# Patient Record
Sex: Male | Born: 1955
Health system: Southern US, Community
[De-identification: ages and names within clinical notes are randomized; demographics above are authoritative.]

## PROBLEM LIST (undated history)

## (undated) DIAGNOSIS — K219 Gastro-esophageal reflux disease without esophagitis: Secondary | ICD-10-CM

## (undated) DIAGNOSIS — G4733 Obstructive sleep apnea (adult) (pediatric): Secondary | ICD-10-CM

## (undated) DIAGNOSIS — I10 Essential (primary) hypertension: Secondary | ICD-10-CM

## (undated) DIAGNOSIS — N400 Enlarged prostate without lower urinary tract symptoms: Secondary | ICD-10-CM

## (undated) DIAGNOSIS — R7989 Other specified abnormal findings of blood chemistry: Secondary | ICD-10-CM

## (undated) DIAGNOSIS — R079 Chest pain, unspecified: Secondary | ICD-10-CM

## (undated) HISTORY — PX: HERNIA REPAIR: SHX51

## (undated) HISTORY — PX: LITHOTRIPSY: SUR834

## (undated) HISTORY — PX: GALLBLADDER SURGERY: SHX652

---

## 1998-09-24 HISTORY — PX: KNEE SURGERY: SHX244

## 2000-09-24 HISTORY — PX: EYE SURGERY: SHX253

## 2005-01-10 ENCOUNTER — Encounter: Payer: Self-pay | Admitting: Family Medicine

## 2005-01-10 ENCOUNTER — Ambulatory Visit: Payer: Self-pay | Admitting: Family Medicine

## 2005-01-24 DIAGNOSIS — G4733 Obstructive sleep apnea (adult) (pediatric): Secondary | ICD-10-CM | POA: Insufficient documentation

## 2007-05-16 ENCOUNTER — Ambulatory Visit: Payer: Self-pay | Admitting: Urology

## 2007-05-23 ENCOUNTER — Ambulatory Visit: Payer: Self-pay | Admitting: Urology

## 2007-10-29 ENCOUNTER — Ambulatory Visit: Payer: Self-pay | Admitting: Urology

## 2008-03-19 ENCOUNTER — Ambulatory Visit: Payer: Self-pay | Admitting: Unknown Physician Specialty

## 2008-03-19 DIAGNOSIS — K5730 Diverticulosis of large intestine without perforation or abscess without bleeding: Secondary | ICD-10-CM | POA: Insufficient documentation

## 2008-03-19 LAB — HM COLONOSCOPY

## 2008-10-28 DIAGNOSIS — K219 Gastro-esophageal reflux disease without esophagitis: Secondary | ICD-10-CM | POA: Insufficient documentation

## 2008-10-28 DIAGNOSIS — I10 Essential (primary) hypertension: Secondary | ICD-10-CM | POA: Insufficient documentation

## 2008-11-23 ENCOUNTER — Ambulatory Visit: Payer: Self-pay | Admitting: Urology

## 2008-11-29 ENCOUNTER — Ambulatory Visit: Payer: Self-pay | Admitting: Family Medicine

## 2009-03-02 DIAGNOSIS — E559 Vitamin D deficiency, unspecified: Secondary | ICD-10-CM | POA: Insufficient documentation

## 2009-11-23 ENCOUNTER — Ambulatory Visit: Payer: Self-pay | Admitting: Urology

## 2010-02-27 DIAGNOSIS — E669 Obesity, unspecified: Secondary | ICD-10-CM | POA: Insufficient documentation

## 2010-11-29 ENCOUNTER — Ambulatory Visit: Payer: Self-pay | Admitting: Urology

## 2011-11-28 ENCOUNTER — Ambulatory Visit: Payer: Self-pay | Admitting: Urology

## 2012-06-11 DIAGNOSIS — N401 Enlarged prostate with lower urinary tract symptoms: Secondary | ICD-10-CM | POA: Insufficient documentation

## 2012-06-11 DIAGNOSIS — N200 Calculus of kidney: Secondary | ICD-10-CM | POA: Insufficient documentation

## 2012-06-11 DIAGNOSIS — N529 Male erectile dysfunction, unspecified: Secondary | ICD-10-CM | POA: Insufficient documentation

## 2012-06-11 DIAGNOSIS — N138 Other obstructive and reflux uropathy: Secondary | ICD-10-CM | POA: Insufficient documentation

## 2012-06-11 DIAGNOSIS — R6882 Decreased libido: Secondary | ICD-10-CM | POA: Insufficient documentation

## 2012-12-24 ENCOUNTER — Ambulatory Visit: Payer: Self-pay | Admitting: Urology

## 2013-01-25 ENCOUNTER — Encounter (HOSPITAL_COMMUNITY): Payer: Self-pay | Admitting: *Deleted

## 2013-01-25 ENCOUNTER — Emergency Department: Payer: Self-pay | Admitting: Emergency Medicine

## 2013-01-25 ENCOUNTER — Observation Stay (HOSPITAL_COMMUNITY)
Admission: EM | Admit: 2013-01-25 | Discharge: 2013-01-27 | Disposition: A | Discharge status: Home / Self Care | Payer: BC Managed Care – PPO | Attending: General Surgery | Admitting: General Surgery

## 2013-01-25 ENCOUNTER — Other Ambulatory Visit: Payer: Self-pay

## 2013-01-25 DIAGNOSIS — S2611XA Contusion of heart without hemopericardium, initial encounter: Secondary | ICD-10-CM

## 2013-01-25 DIAGNOSIS — R071 Chest pain on breathing: Secondary | ICD-10-CM | POA: Insufficient documentation

## 2013-01-25 DIAGNOSIS — I4510 Unspecified right bundle-branch block: Secondary | ICD-10-CM | POA: Insufficient documentation

## 2013-01-25 DIAGNOSIS — R61 Generalized hyperhidrosis: Secondary | ICD-10-CM | POA: Insufficient documentation

## 2013-01-25 DIAGNOSIS — G4733 Obstructive sleep apnea (adult) (pediatric): Secondary | ICD-10-CM | POA: Insufficient documentation

## 2013-01-25 DIAGNOSIS — E669 Obesity, unspecified: Secondary | ICD-10-CM | POA: Insufficient documentation

## 2013-01-25 DIAGNOSIS — I10 Essential (primary) hypertension: Secondary | ICD-10-CM | POA: Insufficient documentation

## 2013-01-25 DIAGNOSIS — Z79899 Other long term (current) drug therapy: Secondary | ICD-10-CM | POA: Insufficient documentation

## 2013-01-25 DIAGNOSIS — N400 Enlarged prostate without lower urinary tract symptoms: Secondary | ICD-10-CM | POA: Insufficient documentation

## 2013-01-25 DIAGNOSIS — S20219A Contusion of unspecified front wall of thorax, initial encounter: Secondary | ICD-10-CM

## 2013-01-25 DIAGNOSIS — S2691XA Contusion of heart, unspecified with or without hemopericardium, initial encounter: Principal | ICD-10-CM

## 2013-01-25 HISTORY — DX: Gastro-esophageal reflux disease without esophagitis: K21.9

## 2013-01-25 HISTORY — DX: Other specified abnormal findings of blood chemistry: R79.89

## 2013-01-25 HISTORY — DX: Obstructive sleep apnea (adult) (pediatric): G47.33

## 2013-01-25 HISTORY — DX: Benign prostatic hyperplasia without lower urinary tract symptoms: N40.0

## 2013-01-25 HISTORY — DX: Chest pain, unspecified: R07.9

## 2013-01-25 HISTORY — DX: Essential (primary) hypertension: I10

## 2013-01-25 LAB — PROTIME-INR: Prothrombin Time: 14.2 secs (ref 11.5–14.7)

## 2013-01-25 LAB — TROPONIN I: Troponin-I: <0.02 ng/mL

## 2013-01-25 LAB — CBC
HGB: 14.8 g/dL (ref 13.0–18.0)
MCH: 30.6 pg (ref 26.0–34.0)
MCV: 90 fL (ref 80–100)
Platelet: 275 10*3/uL (ref 150–440)
RDW: 13.7 % (ref 11.5–14.5)
WBC: 12 10*3/uL — ABNORMAL HIGH (ref 3.8–10.6)

## 2013-01-25 LAB — COMPREHENSIVE METABOLIC PANEL
Alkaline Phosphatase: 115 U/L (ref 50–136)
Bilirubin,Total: 0.4 mg/dL (ref 0.2–1.0)
Calcium, Total: 8.8 mg/dL (ref 8.5–10.1)
Chloride: 110 mmol/L — ABNORMAL HIGH (ref 98–107)
Co2: 24 mmol/L (ref 21–32)
Creatinine: 1.74 mg/dL — ABNORMAL HIGH (ref 0.60–1.30)
EGFR (African American): 49 — ABNORMAL LOW
EGFR (Non-African Amer.): 42 — ABNORMAL LOW
Osmolality: 287 (ref 275–301)
Sodium: 142 mmol/L (ref 136–145)

## 2013-01-25 LAB — POCT I-STAT TROPONIN I: Troponin i, poc: 0 ng/mL (ref 0.00–0.08)

## 2013-01-25 MED ORDER — HYDROCODONE-ACETAMINOPHEN 5-325 MG PO TABS: 0.5000 | ORAL_TABLET | ORAL | Status: DC | PRN

## 2013-01-25 MED ORDER — DOCUSATE SODIUM 100 MG PO CAPS
100.0000 mg | ORAL_CAPSULE | Freq: Two times a day (BID) | ORAL | Status: DC
  Administered 2013-01-26 – 2013-01-27 (×3): 100 mg via ORAL
  Filled 2013-01-25 (×3): qty 1

## 2013-01-25 MED ORDER — SODIUM CHLORIDE 0.9 % IJ SOLN
3.0000 mL | Freq: Two times a day (BID) | INTRAMUSCULAR | Status: DC
  Administered 2013-01-26 – 2013-01-27 (×3): 3 mL via INTRAVENOUS

## 2013-01-25 MED ORDER — HYDROMORPHONE HCL PF 1 MG/ML IJ SOLN
1.0000 mg | INTRAMUSCULAR | Status: DC | PRN
  Administered 2013-01-26: 1 mg via INTRAVENOUS
  Filled 2013-01-25: qty 1

## 2013-01-25 MED ORDER — SODIUM CHLORIDE 0.9 % IJ SOLN: 3.0000 mL | INTRAMUSCULAR | Status: DC | PRN

## 2013-01-25 MED ORDER — ONDANSETRON HCL 4 MG PO TABS: 4.0000 mg | ORAL_TABLET | Freq: Four times a day (QID) | ORAL | Status: DC | PRN

## 2013-01-25 MED ORDER — MORPHINE SULFATE 4 MG/ML IJ SOLN
4.0000 mg | Freq: Once | INTRAMUSCULAR | Status: AC
  Administered 2013-01-25: 4 mg via INTRAVENOUS
  Filled 2013-01-25: qty 1

## 2013-01-25 MED ORDER — PANTOPRAZOLE SODIUM 40 MG PO TBEC: 40.0000 mg | DELAYED_RELEASE_TABLET | Freq: Every day | ORAL | Status: DC

## 2013-01-25 MED ORDER — AMLODIPINE BESY-BENAZEPRIL HCL 10-20 MG PO CAPS: 1.0000 | ORAL_CAPSULE | Freq: Every day | ORAL | Status: DC

## 2013-01-25 MED ORDER — ONDANSETRON HCL 4 MG/2ML IJ SOLN: 4.0000 mg | Freq: Four times a day (QID) | INTRAMUSCULAR | Status: DC | PRN

## 2013-01-25 MED ORDER — PANTOPRAZOLE SODIUM 40 MG IV SOLR
40.0000 mg | Freq: Every day | INTRAVENOUS | Status: DC
  Filled 2013-01-25: qty 40

## 2013-01-25 MED ORDER — ALFUZOSIN HCL ER 10 MG PO TB24
10.0000 mg | ORAL_TABLET | Freq: Every day | ORAL | Status: DC
  Administered 2013-01-26 (×2): 10 mg via ORAL
  Filled 2013-01-25 (×3): qty 1

## 2013-01-25 MED ORDER — ONDANSETRON HCL 4 MG/2ML IJ SOLN
4.0000 mg | Freq: Once | INTRAMUSCULAR | Status: AC
  Administered 2013-01-25: 4 mg via INTRAVENOUS
  Filled 2013-01-25: qty 2

## 2013-01-25 MED ORDER — ENOXAPARIN SODIUM 40 MG/0.4ML ~~LOC~~ SOLN
40.0000 mg | Freq: Every day | SUBCUTANEOUS | Status: DC
  Filled 2013-01-25: qty 0.4

## 2013-01-25 MED ORDER — SODIUM CHLORIDE 0.9 % IV SOLN: 250.0000 mL | INTRAVENOUS | Status: DC | PRN

## 2013-01-25 NOTE — ED Provider Notes (Signed)
I saw and evaluated the patient, reviewed the resident's note and I agree with the findings and plan.   Rolan Bucco, MD 01/25/13 [phone]
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
## 2013-01-25 NOTE — ED Notes (Signed)
Dr. Wyatt at bedside to assess pt. 
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
## 2013-01-26 ENCOUNTER — Observation Stay (HOSPITAL_COMMUNITY): Payer: BC Managed Care – PPO

## 2013-01-26 ENCOUNTER — Encounter (HOSPITAL_COMMUNITY): Payer: Self-pay | Admitting: Nurse Practitioner

## 2013-01-26 DIAGNOSIS — E669 Obesity, unspecified: Secondary | ICD-10-CM | POA: Insufficient documentation

## 2013-01-26 DIAGNOSIS — I10 Essential (primary) hypertension: Secondary | ICD-10-CM

## 2013-01-26 DIAGNOSIS — R079 Chest pain, unspecified: Secondary | ICD-10-CM

## 2013-01-26 DIAGNOSIS — S2691XA Contusion of heart, unspecified with or without hemopericardium, initial encounter: Secondary | ICD-10-CM

## 2013-01-26 DIAGNOSIS — I519 Heart disease, unspecified: Secondary | ICD-10-CM

## 2013-01-26 DIAGNOSIS — G4733 Obstructive sleep apnea (adult) (pediatric): Secondary | ICD-10-CM | POA: Insufficient documentation

## 2013-01-26 LAB — BASIC METABOLIC PANEL
BUN: 16 mg/dL (ref 6–23)
Calcium: 8.9 mg/dL (ref 8.4–10.5)
GFR calc Af Amer: 70 mL/min — ABNORMAL LOW (ref >90)
GFR calc non Af Amer: 60 mL/min — ABNORMAL LOW (ref >90)
Glucose, Bld: 123 mg/dL — ABNORMAL HIGH (ref 70–99)

## 2013-01-26 LAB — CBC
HCT: 40.1 % (ref 39.0–52.0)
Hemoglobin: 13.5 g/dL (ref 13.0–17.0)
MCH: 30.5 pg (ref 26.0–34.0)
MCHC: 33.7 g/dL (ref 30.0–36.0)
RDW: 14.2 % (ref 11.5–15.5)

## 2013-01-26 MED ORDER — HYDROMORPHONE HCL PF 1 MG/ML IJ SOLN
0.5000 mg | INTRAMUSCULAR | Status: DC | PRN
  Administered 2013-01-26: 0.5 mg via INTRAVENOUS
  Filled 2013-01-26: qty 1

## 2013-01-26 MED ORDER — ENOXAPARIN SODIUM 30 MG/0.3ML ~~LOC~~ SOLN
30.0000 mg | Freq: Two times a day (BID) | SUBCUTANEOUS | Status: DC
  Administered 2013-01-26 – 2013-01-27 (×3): 30 mg via SUBCUTANEOUS
  Filled 2013-01-26 (×5): qty 0.3

## 2013-01-26 MED ORDER — NAPROXEN 500 MG PO TABS
500.0000 mg | ORAL_TABLET | Freq: Two times a day (BID) | ORAL | Status: DC
  Administered 2013-01-26 – 2013-01-27 (×2): 500 mg via ORAL
  Filled 2013-01-26 (×7): qty 1

## 2013-01-26 MED ORDER — AMLODIPINE BESYLATE 10 MG PO TABS
10.0000 mg | ORAL_TABLET | Freq: Every day | ORAL | Status: DC
  Administered 2013-01-26 – 2013-01-27 (×2): 10 mg via ORAL
  Filled 2013-01-26 (×2): qty 1

## 2013-01-26 MED ORDER — BENAZEPRIL HCL 20 MG PO TABS
20.0000 mg | ORAL_TABLET | Freq: Every day | ORAL | Status: DC
  Administered 2013-01-26 – 2013-01-27 (×2): 20 mg via ORAL
  Filled 2013-01-26 (×2): qty 1

## 2013-01-26 MED ORDER — PANTOPRAZOLE SODIUM 40 MG PO TBEC
40.0000 mg | DELAYED_RELEASE_TABLET | Freq: Every day | ORAL | Status: DC
  Administered 2013-01-26 – 2013-01-27 (×2): 40 mg via ORAL
  Filled 2013-01-26 (×2): qty 1

## 2013-01-26 MED ORDER — HYDROCODONE-ACETAMINOPHEN 10-325 MG PO TABS
0.5000 | ORAL_TABLET | ORAL | Status: DC | PRN
  Administered 2013-01-26 (×2): 0.5 via ORAL
  Filled 2013-01-26 (×2): qty 1

## 2013-01-26 NOTE — Progress Notes (Signed)
  Echocardiogram 2D Echocardiogram has been performed.  Jose Lewis 01/26/2013, 3:44 PM

## 2013-01-26 NOTE — Progress Notes (Signed)
UR completed 
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
## 2013-01-26 NOTE — Progress Notes (Signed)
Appreciate cardiology input.  R chest wall and sternum tender.  Working with PT. Patient examined and I agree with the assessment and plan  Violeta Gelinas, MD, MPH, FACS Pager: [phone]  01/26/2013 3:35 PM
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
## 2013-01-26 NOTE — Progress Notes (Signed)
PT has refused CPAP machine for the night. Pt states he slept fine last night with his nasal cannula and believes he will do fine again tonight with just the cannula. Pt also says he should be leaving in the morning and does not want to try our CPAP. RT advised PT to call if he changes his mind or if he feels like he needs the machine to sleep. RT will continue to assist as needed.
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
## 2013-01-27 ENCOUNTER — Encounter (HOSPITAL_COMMUNITY): Payer: Self-pay | Admitting: General Practice

## 2013-01-27 DIAGNOSIS — N400 Enlarged prostate without lower urinary tract symptoms: Secondary | ICD-10-CM | POA: Insufficient documentation

## 2013-01-27 MED ORDER — NAPROXEN 500 MG PO TABS: 500.0000 mg | ORAL_TABLET | Freq: Two times a day (BID) | ORAL | Status: DC

## 2013-01-27 MED ORDER — HYDROCODONE-ACETAMINOPHEN 5-325 MG PO TABS
1.0000 | ORAL_TABLET | ORAL | Status: DC | PRN
Start: 2013-01-27 — End: 2013-05-13

## 2013-01-27 NOTE — Progress Notes (Signed)
Ready for D/C Violeta Gelinas, MD, MPH, FACS Pager: [phone]
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
## 2013-01-27 NOTE — Discharge Summary (Signed)
Kolette Vey, MD, MPH, FACS Pager: [phone]  

## 2013-01-27 NOTE — Progress Notes (Addendum)
Physical Therapy Treatment Patient Details Name: Jose Lewis MRN: [id number] DOB: [birth year] Today's Date: 01/27/2013 Time: 7829-5621 PT Time Calculation (min): 38 min  PT Assessment / Plan / Recommendation Comments on Treatment Session  Pt still with guarded, painful movement, but all education on safe mobility and progression from this point in recovery completed.  Pt ready for D/C.    Follow Up Recommendations  No PT follow up;Supervision - Intermittent     Does the patient have the potential to tolerate intense rehabilitation     Barriers to Discharge        Equipment Recommendations  None recommended by PT    Recommendations for Other Services    Frequency Min 3X/week   Plan Discharge plan remains appropriate;Frequency remains appropriate    Precautions / Restrictions Precautions Precautions: None Restrictions Weight Bearing Restrictions: No   Pertinent Vitals/Pain Not rated, but painful chest wall bilaterally     Mobility  Bed Mobility Bed Mobility: Rolling Left;Left Sidelying to Sit;Sitting - Scoot to [place] of Bed;Sit to Sidelying Left Rolling Left: 5: Supervision Left Sidelying to Sit: 4: Min guard;HOB flat (with or without rail) Sitting - Scoot to Edge of Bed: 6: Modified independent (Device/Increase time) Sit to Sidelying Left: 5: Supervision;HOB flat;Other (comment) (no rail) Details for Bed Mobility Assistance: Practiced methods for supine to/from EOB without use of rail Transfers Transfers: Sit to Stand;Stand to Sit Sit to Stand: 6: Modified independent (Device/Increase time) Stand to Sit: 6: Modified independent (Device/Increase time) Details for Transfer Assistance: vc's for hand placement, not assist needed Ambulation/Gait Ambulation/Gait Assistance: 5: Supervision Ambulation Distance (Feet): 150 Feet Assistive device: None Ambulation/Gait Assistance Details: steady and smooth, but guarded Gait Pattern: Within Functional Limits Gait velocity: Slow  gait speed Stairs: Yes Stairs Assistance: 5: Supervision Stair Management Technique: One rail Right;Alternating pattern;Step to pattern;Forwards Number of Stairs: 12 Wheelchair Mobility Wheelchair Mobility: No    Exercises     PT Diagnosis:    PT Problem List:   PT Treatment Interventions:     PT Goals Acute Rehab PT Goals Potential to Achieve Goals: Good Pt will Roll Supine to Left Side: Independently PT Goal: Rolling Supine to Left Side - Progress: Progressing toward goal Pt will go Supine/Side to Sit: Independently;with HOB 0 degrees PT Goal: Supine/Side to Sit - Progress: Progressing toward goal Pt will go Sit to Supine/Side: Independently;with HOB 0 degrees PT Goal: Sit to Supine/Side - Progress: Progressing toward goal Pt will go Sit to Stand: with modified independence;with upper extremity assist PT Goal: Sit to Stand - Progress: Met Pt will Ambulate: >150 feet;with modified independence;with rolling walker PT Goal: Ambulate - Progress: Met Pt will Go Up / Down Stairs: 3-5 stairs;with supervision;with rail(s) PT Goal: Up/Down Stairs - Progress: Met  Visit Information  Last PT Received On: 01/27/13 Assistance Needed: +1    Subjective Data  Subjective: I'm concerned about going up stairs.Marland KitchenMarland KitchenI'll be going to my sister's home...the beds are upstairs.   Cognition  Cognition Arousal/Alertness: Awake/alert Behavior During Therapy: WFL for tasks assessed/performed Overall Cognitive Status: Within Functional Limits for tasks assessed    Balance  Static Sitting Balance Static Sitting - Balance Support: Feet supported;No upper extremity supported Static Sitting - Level of Assistance: 7: Independent Static Standing Balance Static Standing - Balance Support: No upper extremity supported;During functional activity Static Standing - Level of Assistance: 7: Independent  End of Session PT - End of Session Activity Tolerance: Patient tolerated treatment well Patient left: in  bed;with call bell/phone within  reach Nurse Communication: Mobility status   GP Mobility: Walking and Moving Around Discharge Status [phone]): At least 1 percent but less than 20 percent impaired, limited or restricted   Kym Fenter, Eliseo Gum 01/27/2013, 11:18 AM

## 2013-01-27 NOTE — Progress Notes (Signed)
Delivered rolling walker to patient's room.  Patient refused walker stating therapy said he no longer needed one for home.  Jose Lewis [phone]

## 2013-05-01 ENCOUNTER — Ambulatory Visit: Payer: Self-pay | Admitting: Urology

## 2013-05-01 DIAGNOSIS — R972 Elevated prostate specific antigen [PSA]: Secondary | ICD-10-CM | POA: Insufficient documentation

## 2013-05-13 ENCOUNTER — Ambulatory Visit (INDEPENDENT_AMBULATORY_CARE_PROVIDER_SITE_OTHER): Payer: BC Managed Care – PPO | Admitting: General Surgery

## 2013-05-13 ENCOUNTER — Encounter: Payer: Self-pay | Admitting: General Surgery

## 2013-05-13 VITALS — BP 140/82 | HR 72 | Resp 12 | Ht 72.0 in | Wt 263.0 lb

## 2013-05-13 DIAGNOSIS — L089 Local infection of the skin and subcutaneous tissue, unspecified: Secondary | ICD-10-CM | POA: Insufficient documentation

## 2013-05-13 DIAGNOSIS — L723 Sebaceous cyst: Secondary | ICD-10-CM

## 2013-05-13 MED ORDER — SULFAMETHOXAZOLE-TRIMETHOPRIM 800-160 MG PO TABS: 1.0000 | ORAL_TABLET | Freq: Two times a day (BID) | ORAL | Status: DC

## 2013-05-13 NOTE — Patient Instructions (Signed)
Patient to return in one month. 
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
## 2013-06-10 ENCOUNTER — Ambulatory Visit: Payer: BC Managed Care – PPO | Admitting: General Surgery

## 2013-11-26 ENCOUNTER — Ambulatory Visit: Payer: Self-pay | Admitting: Urology

## 2014-07-30 ENCOUNTER — Ambulatory Visit: Payer: Self-pay | Admitting: Specialist

## 2014-08-12 ENCOUNTER — Ambulatory Visit: Payer: Self-pay | Admitting: Family Medicine

## 2014-08-18 ENCOUNTER — Ambulatory Visit: Payer: Self-pay | Admitting: Specialist

## 2015-01-15 NOTE — Op Note (Signed)
PATIENT NAME:  Jose Lewis, Jose Lewis MR#:  [id number] DATE OF BIRTH:  November 27, 1955  DATE OF PROCEDURE:  08/18/2014  PREOPERATIVE DIAGNOSES:   1.  Complex tear of the posterior horn of the medial meniscus. 2.  Tear of the inner border of the right lateral meniscus.   POSTOPERATIVE DIAGNOSES: 1.  Complex tear of the posterior horn of the medial meniscus. 2.  Tear of the inner border of the right lateral meniscus.   PROCEDURE: Arthroscopic partial right medial and lateral meniscectomy.   SURGEON: Park Breed, M.D.   ANESTHESIA: General LMA.   COMPLICATIONS: None.   DRAINS: None.   ESTIMATED BLOOD LOSS: Minimal.   REPLACEMENTS: None.   OPERATIVE FINDINGS: The patient had a complex tear of the posterior horn of the medial meniscus with large flaps. The articular surface of the femur and tibia were intact with minimal fraying. The anterior cruciate and posterior cruciates were intact. There was only mild synovitis. The lateral compartment showed a tearing of the inner border of the lateral meniscus. The popliteus tendon was intact. The patellofemoral joint showed no significant chondromalacia. There were no loose bodies. The suprapatellar pouch was normal.   OPERATIVE PROCEDURE: The patient was brought to the operating room where he underwent satisfactory general LMA anesthesia in the supine position. Arthroscopy was carried out through standard portals. The above findings were encountered. The posterior horn of the medial meniscus was debrided with basket forceps, motorized resector and ArthroCare wand. Healthy rim was obtained and all frayed and torn meniscus was removed. The knee was placed in figure-of-four position and the lateral meniscus was shown to have significant tearing of the inner border. This was debrided with basket forceps, motorized resector and ArthroCare wand as well. The popliteal hiatus was left intact. Minimal cauterization was done on the anterior synovium. The knee was  thoroughly irrigated, free of debris. The pump pressure was lowered to 20 mg and there was no significant bleeding. The instruments were removed, and the stab wounds were closed with 3-0 nylon suture. Marcaine 0.5% with epinephrine and morphine were placed in the joint and a dry sterile dressing was applied. The tourniquet was not used. The patient was awakened and taken to recovery in good condition.   ____________________________ Park Breed, MD hem:DT D: 08/18/2014 09:25:06 ET T: 08/18/2014 11:34:17 ET JOB#: [id number]  cc: Park Breed, MD, <Dictator> Park Breed MD ELECTRONICALLY SIGNED 08/22/2014 22:59

## 2015-02-10 LAB — LIPID PANEL
Cholesterol: 211 mg/dL — AB (ref 0–200)
HDL: 30 mg/dL — AB (ref 35–70)
LDL CALC: 138 mg/dL
LDL/HDL RATIO: 4.6
TRIGLYCERIDES: 214 mg/dL — AB (ref 40–160)

## 2015-02-10 LAB — HEPATIC FUNCTION PANEL
ALT: 17 U/L (ref 10–40)
AST: 14 U/L (ref 14–40)
Alkaline Phosphatase: 90 U/L (ref 25–125)

## 2015-02-10 LAB — TSH: TSH: 1.12 u[IU]/mL (ref 0.41–5.90)

## 2015-02-10 LAB — CBC AND DIFFERENTIAL
HEMATOCRIT: 43 % (ref 41–53)
HEMOGLOBIN: 14.5 g/dL (ref 13.5–17.5)
NEUTROS ABS: 4 /uL
PLATELETS: 220 10*3/uL (ref 150–399)
WBC: 5.8 10^3/mL

## 2015-02-10 LAB — BASIC METABOLIC PANEL
BUN: 11 mg/dL (ref 4–21)
Creatinine: 1.1 mg/dL (ref 0.6–1.3)
Glucose: 103 mg/dL
Potassium: 4.7 mmol/L (ref 3.4–5.3)
SODIUM: 144 mmol/L (ref 137–147)

## 2015-02-24 DIAGNOSIS — M25579 Pain in unspecified ankle and joints of unspecified foot: Secondary | ICD-10-CM | POA: Insufficient documentation

## 2015-06-13 ENCOUNTER — Ambulatory Visit (INDEPENDENT_AMBULATORY_CARE_PROVIDER_SITE_OTHER): Payer: 59 | Admitting: Family Medicine

## 2015-06-13 ENCOUNTER — Encounter: Payer: Self-pay | Admitting: Family Medicine

## 2015-06-13 VITALS — BP 128/62 | HR 92 | Temp 98.4°F | Resp 18 | Wt 274.0 lb

## 2015-06-13 DIAGNOSIS — Z9889 Other specified postprocedural states: Secondary | ICD-10-CM | POA: Insufficient documentation

## 2015-06-13 DIAGNOSIS — J309 Allergic rhinitis, unspecified: Secondary | ICD-10-CM | POA: Insufficient documentation

## 2015-06-13 DIAGNOSIS — H00013 Hordeolum externum right eye, unspecified eyelid: Secondary | ICD-10-CM

## 2015-06-13 DIAGNOSIS — L509 Urticaria, unspecified: Secondary | ICD-10-CM | POA: Diagnosis not present

## 2015-06-13 DIAGNOSIS — E785 Hyperlipidemia, unspecified: Secondary | ICD-10-CM | POA: Insufficient documentation

## 2015-06-13 DIAGNOSIS — M129 Arthropathy, unspecified: Secondary | ICD-10-CM | POA: Insufficient documentation

## 2015-06-13 MED ORDER — LORATADINE 10 MG PO TABS: 10.0000 mg | ORAL_TABLET | Freq: Every day | ORAL | Status: DC

## 2015-06-13 MED ORDER — DOXYCYCLINE HYCLATE 100 MG PO TABS: 100.0000 mg | ORAL_TABLET | Freq: Two times a day (BID) | ORAL | Status: DC

## 2015-06-13 MED ORDER — CIPROFLOXACIN HCL 0.3 % OP SOLN: 2.0000 [drp] | OPHTHALMIC | Status: DC
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
## 2015-06-14 MED ORDER — CIPROFLOXACIN HCL 0.3 % OP SOLN: 2.0000 [drp] | OPHTHALMIC | Status: DC

## 2015-06-14 NOTE — Addendum Note (Signed)
Addended by: Arnette Norris on: 06/14/2015 01:35 PM   Modules accepted: Orders

## 2015-06-30 ENCOUNTER — Telehealth: Payer: Self-pay | Admitting: Family Medicine

## 2015-06-30 DIAGNOSIS — L209 Atopic dermatitis, unspecified: Secondary | ICD-10-CM

## 2015-06-30 MED ORDER — DOXEPIN HCL 10 MG PO CAPS: 10.0000 mg | ORAL_CAPSULE | Freq: Every day | ORAL | Status: DC

## 2015-06-30 NOTE — Telephone Encounter (Signed)
Advised patient as below. Patient reports that he is already taking Loratadine 10mg  daily along with Zantac OTC. Sent in Doxepin into the pharmacy. Referral was also ordered. Will contact patient once referral has been scheduled. Thanks!

## 2015-06-30 NOTE — Telephone Encounter (Signed)
Pt states he was seen 06/13/2015 and was having itching all over his body.  Pt states he is still having the itching.  Pt is requesting a referral to see a specialist.  CB#[phone] or [phone]

## 2015-06-30 NOTE — Telephone Encounter (Signed)
Dr. Darnell Level, who would you like the patient to be referred to? Patient is not feeling any better. Please advise. Thanks!

## 2015-06-30 NOTE — Telephone Encounter (Signed)
Refer to dermatology. In the meantime would use a month's worth of each with 5 refills. loratadine 10 mg daily and doxepin 10 mg 4 times a day. Would ask patient to doxepin will make him sleepy.

## 2015-07-14 ENCOUNTER — Other Ambulatory Visit: Payer: Self-pay | Admitting: Family Medicine

## 2015-09-02 ENCOUNTER — Ambulatory Visit (INDEPENDENT_AMBULATORY_CARE_PROVIDER_SITE_OTHER): Payer: 59

## 2015-09-02 DIAGNOSIS — Z23 Encounter for immunization: Secondary | ICD-10-CM

## 2015-09-05 ENCOUNTER — Encounter: Payer: Self-pay | Admitting: Family Medicine

## 2016-03-01 ENCOUNTER — Ambulatory Visit (INDEPENDENT_AMBULATORY_CARE_PROVIDER_SITE_OTHER): Payer: BLUE CROSS/BLUE SHIELD | Admitting: Family Medicine

## 2016-03-01 ENCOUNTER — Encounter: Payer: Self-pay | Admitting: Family Medicine

## 2016-03-01 VITALS — BP 132/80 | HR 70 | Temp 98.1°F | Resp 16 | Ht 71.0 in | Wt 278.0 lb

## 2016-03-01 DIAGNOSIS — Z0000 Encounter for general adult medical examination without abnormal findings: Secondary | ICD-10-CM | POA: Diagnosis not present

## 2016-03-01 LAB — POCT URINALYSIS DIPSTICK
Bilirubin, UA: NEGATIVE
GLUCOSE UA: NEGATIVE
Ketones, UA: NEGATIVE
Leukocytes, UA: NEGATIVE
NITRITE UA: NEGATIVE
PROTEIN UA: NEGATIVE
SPEC GRAV UA: 1.01
Urobilinogen, UA: 0.2
pH, UA: 5

## 2016-03-01 LAB — IFOBT (OCCULT BLOOD): IFOBT: NEGATIVE
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
## 2016-03-03 LAB — CBC
HEMATOCRIT: 44.1 % (ref 37.5–51.0)
Hemoglobin: 15.5 g/dL (ref 12.6–17.7)
MCH: 30.3 pg (ref 26.6–33.0)
MCHC: 35.1 g/dL (ref 31.5–35.7)
MCV: 86 fL (ref 79–97)
PLATELETS: 198 10*3/uL (ref 150–379)
RBC: 5.12 x10E6/uL (ref 4.14–5.80)
RDW: 14 % (ref 12.3–15.4)
WBC: 5.3 10*3/uL (ref 3.4–10.8)

## 2016-03-03 LAB — COMPREHENSIVE METABOLIC PANEL
ALBUMIN: 4.4 g/dL (ref 3.6–4.8)
ALT: 20 IU/L (ref 0–44)
AST: 16 IU/L (ref 0–40)
Albumin/Globulin Ratio: 1.6 (ref 1.2–2.2)
Alkaline Phosphatase: 84 IU/L (ref 39–117)
BUN / CREAT RATIO: 13 (ref 10–24)
BUN: 15 mg/dL (ref 8–27)
Bilirubin Total: 0.6 mg/dL (ref 0.0–1.2)
CO2: 23 mmol/L (ref 18–29)
CREATININE: 1.16 mg/dL (ref 0.76–1.27)
Calcium: 9.6 mg/dL (ref 8.6–10.2)
Chloride: 101 mmol/L (ref 96–106)
GFR, EST AFRICAN AMERICAN: 78 mL/min/{1.73_m2} (ref >59)
GFR, EST NON AFRICAN AMERICAN: 68 mL/min/{1.73_m2} (ref >59)
GLOBULIN, TOTAL: 2.8 g/dL (ref 1.5–4.5)
GLUCOSE: 102 mg/dL — AB (ref 65–99)
Potassium: 4.4 mmol/L (ref 3.5–5.2)
SODIUM: 141 mmol/L (ref 134–144)
TOTAL PROTEIN: 7.2 g/dL (ref 6.0–8.5)

## 2016-03-03 LAB — LIPID PANEL
Chol/HDL Ratio: 6.1 ratio units — ABNORMAL HIGH (ref 0.0–5.0)
Cholesterol, Total: 209 mg/dL — ABNORMAL HIGH (ref 100–199)
HDL: 34 mg/dL — AB (ref >39)
LDL Calculated: 141 mg/dL — ABNORMAL HIGH (ref 0–99)
Triglycerides: 171 mg/dL — ABNORMAL HIGH (ref 0–149)
VLDL CHOLESTEROL CAL: 34 mg/dL (ref 5–40)

## 2016-03-03 LAB — TSH: TSH: 0.902 u[IU]/mL (ref 0.450–4.500)

## 2016-07-16 ENCOUNTER — Other Ambulatory Visit: Payer: Self-pay | Admitting: Family Medicine

## 2016-07-20 ENCOUNTER — Ambulatory Visit (INDEPENDENT_AMBULATORY_CARE_PROVIDER_SITE_OTHER): Payer: BLUE CROSS/BLUE SHIELD

## 2016-07-20 DIAGNOSIS — Z23 Encounter for immunization: Secondary | ICD-10-CM

## 2016-08-08 ENCOUNTER — Encounter: Payer: Self-pay | Admitting: Family Medicine

## 2016-08-08 ENCOUNTER — Ambulatory Visit (INDEPENDENT_AMBULATORY_CARE_PROVIDER_SITE_OTHER): Payer: BLUE CROSS/BLUE SHIELD | Admitting: Family Medicine

## 2016-08-08 VITALS — BP 116/78 | HR 65 | Temp 97.7°F | Resp 16 | Wt 269.0 lb

## 2016-08-08 DIAGNOSIS — K529 Noninfective gastroenteritis and colitis, unspecified: Secondary | ICD-10-CM | POA: Diagnosis not present

## 2016-08-08 MED ORDER — CIPROFLOXACIN HCL 500 MG PO TABS: 500.0000 mg | ORAL_TABLET | Freq: Two times a day (BID) | ORAL | 0 refills | Status: DC

## 2016-08-08 NOTE — Progress Notes (Addendum)
Patient: Jose Lewis Male    DOB: February 24, 1956   60 y.o.   MRN: [id number] Visit Date: 08/08/2016  Today's Provider: [name], MD   Chief Complaint  Patient presents with  . Diarrhea   Subjective:    Diarrhea   This is a new problem. The current episode started 1 to 4 weeks ago (October 30 ). The problem has been waxing and waning. The stool consistency is described as watery. The patient states that diarrhea does not awaken him from sleep. Associated symptoms include weight loss. Exacerbated by: certain food intake at times. He has tried anti-motility drug for the symptoms. The treatment provided moderate relief.   Last episode was yesterday morning. Patient reports after diarrhea yesterday morning he then had a regular stool.    Previous Medications   ACETAMINOPHEN (TYLENOL) 500 MG TABLET    Take by mouth.   ALFUZOSIN (UROXATRAL) 10 MG 24 HR TABLET    Take 10 mg by mouth at bedtime.   AMLODIPINE-BENAZEPRIL (LOTREL) 10-20 MG CAPSULE    TAKE 1 CAPSULE BY MOUTH ONCE DAILY.   ASPIRIN EC 81 MG TABLET    Take 81 mg by mouth at bedtime. Reported on 03/01/2016   CYCLOBENZAPRINE (FLEXERIL) 10 MG TABLET    Take by mouth. Reported on 03/01/2016   DOXEPIN (SINEQUAN) 10 MG CAPSULE    Take 1 capsule (10 mg total) by mouth at bedtime.   GLUCOSAMINE-CHONDROITIN (OSTEO BI-FLEX REGULAR STRENGTH) 250-200 MG TABS    OSTEO BI-FLEX REGULAR STRENGTH, 250-200MG  (Oral Tablet)  1 Every Day for 0 days  Quantity: 0.00;  Refills: 0   Ordered :27-Dec-2010  [name] ;  Started 29-Nov-2008 Active   LORATADINE (CLARITIN) 10 MG TABLET    Take 1 tablet (10 mg total) by mouth daily.   MECLIZINE (ANTIVERT) 25 MG TABLET    Take by mouth. Reported on 03/01/2016   NAPROXEN (NAPROSYN) 500 MG TABLET    Take by mouth. Reported on 03/01/2016   OMEPRAZOLE (PRILOSEC OTC) 20 MG TABLET    Take 20 mg by mouth daily.   TESTOSTERONE (AXIRON) 30 MG/ACT SOLN    Place onto the skin daily. 1 pump under each arm daily   TESTOSTERONE CYPIONATE (DEPOTESTOSTERONE CYPIONATE) 200 MG/ML INJECTION    Inject into the muscle.    Review of Systems  Constitutional: Positive for weight loss.  Respiratory: Negative.   Cardiovascular: Negative.   Gastrointestinal: Positive for diarrhea.  Endocrine: Negative.   Allergic/Immunologic: Negative.   Psychiatric/Behavioral: Negative.     Social History  Substance Use Topics  . Smoking status: Never Smoker  . Smokeless tobacco: Never Used  . Alcohol use Yes     Comment: occasional drink   Objective:   BP 116/78 (BP Location: Right Arm, Patient Position: Sitting, Cuff Size: Large)   Pulse 65   Temp 97.7 F (36.5 C) (Oral)   Resp 16   Wt 269 lb (122 kg)   BMI 37.52 kg/m   Physical Exam  Constitutional: He is oriented to person, place, and time. He appears well-developed and well-nourished.  HENT:  Head: Normocephalic and atraumatic.  Eyes: Conjunctivae are normal. No scleral icterus.  Neck: Neck supple. No thyromegaly present.  Cardiovascular: Normal rate, regular rhythm and normal heart sounds.   Pulmonary/Chest: Effort normal and breath sounds normal.  Abdominal: Soft. He exhibits no distension and no mass. There is no tenderness.  Neurological: He is alert and oriented to person, place, and time.  Skin: Skin is  warm and dry.  Psychiatric: He has a normal mood and affect. His behavior is normal. Judgment and thought content normal.        Assessment & Plan:     1. Colitis--nonspecific Push fluids. Probiotics twice daily for 1 week then once daily. Printed Cipro RX if symptoms get worse. May need GI referral if persistant. - CBC With Differential Discussed with pt if symptoms worsen could emperically treat with Cipro. Pt drinks city water. 2.s/p Prostatectomy for Prostate Cancer--2017  Follow up: Return if symptoms worsen or fail to improve.     I have done the exam and reviewed the chart and it is accurate to the best of my knowledge. Risk manager has been used and  any errors in dictation or transcription are unintentional. Miguel Aschoff M.D. [facility]

## 2016-08-09 ENCOUNTER — Telehealth: Payer: Self-pay

## 2016-08-09 LAB — CBC WITH DIFFERENTIAL
Basophils Absolute: 0 10*3/uL (ref 0.0–0.2)
Basos: 1 %
EOS (ABSOLUTE): 0.1 10*3/uL (ref 0.0–0.4)
EOS: 2 %
HEMATOCRIT: 38 % (ref 37.5–51.0)
Hemoglobin: 12.8 g/dL (ref 12.6–17.7)
IMMATURE GRANS (ABS): 0 10*3/uL (ref 0.0–0.1)
Immature Granulocytes: 1 %
LYMPHS: 20 %
Lymphocytes Absolute: 1.3 10*3/uL (ref 0.7–3.1)
MCH: 30.2 pg (ref 26.6–33.0)
MCHC: 33.7 g/dL (ref 31.5–35.7)
MCV: 90 fL (ref 79–97)
MONOS ABS: 0.4 10*3/uL (ref 0.1–0.9)
Monocytes: 7 %
NEUTROS ABS: 4.5 10*3/uL (ref 1.4–7.0)
NEUTROS PCT: 69 %
RBC: 4.24 x10E6/uL (ref 4.14–5.80)
RDW: 13.7 % (ref 12.3–15.4)
WBC: 6.4 10*3/uL (ref 3.4–10.8)

## 2016-08-09 NOTE — Telephone Encounter (Signed)
[id number] 

## 2016-08-09 NOTE — Telephone Encounter (Signed)
-----   Message from Jerrol Banana., MD sent at 08/09/2016  5:50 AM EST ----- CBC ok

## 2016-08-10 NOTE — Telephone Encounter (Signed)
Patient has been advised. KW 

## 2016-10-05 ENCOUNTER — Ambulatory Visit (INDEPENDENT_AMBULATORY_CARE_PROVIDER_SITE_OTHER): Payer: BLUE CROSS/BLUE SHIELD | Admitting: Physician Assistant

## 2016-10-05 ENCOUNTER — Encounter: Payer: Self-pay | Admitting: Physician Assistant

## 2016-10-05 VITALS — BP 108/60 | HR 80 | Temp 98.7°F | Resp 16 | Wt 272.0 lb

## 2016-10-05 DIAGNOSIS — R05 Cough: Secondary | ICD-10-CM | POA: Diagnosis not present

## 2016-10-05 DIAGNOSIS — R059 Cough, unspecified: Secondary | ICD-10-CM

## 2016-10-05 LAB — POCT INFLUENZA A/B
Influenza A, POC: NEGATIVE
Influenza B, POC: NEGATIVE

## 2016-10-05 NOTE — Patient Instructions (Signed)
Upper Respiratory Infection, Adult Most upper respiratory infections (URIs) are caused by a virus. A URI affects the nose, throat, and upper air passages. The most common type of URI is often called "the common cold." Follow these instructions at home:  Take medicines only as told by your doctor.  Gargle warm saltwater or take cough drops to comfort your throat as told by your doctor.  Use a warm mist humidifier or inhale steam from a shower to increase air moisture. This may make it easier to breathe.  Drink enough fluid to keep your pee (urine) clear or pale yellow.  Eat soups and other clear broths.  Have a healthy diet.  Rest as needed.  Go back to work when your fever is gone or your doctor says it is okay.  You may need to stay home longer to avoid giving your URI to others.  You can also wear a face mask and wash your hands often to prevent spread of the virus.  Use your inhaler more if you have asthma.  Do not use any tobacco products, including cigarettes, chewing tobacco, or electronic cigarettes. If you need help quitting, ask your doctor. Contact a doctor if:  You are getting worse, not better.  Your symptoms are not helped by medicine.  You have chills.  You are getting more short of breath.  You have brown or red mucus.  You have yellow or brown discharge from your nose.  You have pain in your face, especially when you bend forward.  You have a fever.  You have puffy (swollen) neck glands.  You have pain while swallowing.  You have white areas in the back of your throat. Get help right away if:  You have very bad or constant:  Headache.  Ear pain.  Pain in your forehead, behind your eyes, and over your cheekbones (sinus pain).  Chest pain.  You have long-lasting (chronic) lung disease and any of the following:  Wheezing.  Long-lasting cough.  Coughing up blood.  A change in your usual mucus.  You have a stiff neck.  You have  changes in your:  Vision.  Hearing.  Thinking.  Mood. This information is not intended to replace advice given to you by your health care provider. Make sure you discuss any questions you have with your health care provider. Document Released: 02/27/2008 Document Revised: 05/13/2016 Document Reviewed: 12/16/2013 Elsevier Interactive Patient Education  2017 [employer]  
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
## 2016-11-08 ENCOUNTER — Telehealth: Payer: Self-pay | Admitting: Family Medicine

## 2016-11-08 NOTE — Telephone Encounter (Signed)
Pt states he is needing an RX sent to [facility] on [address] stating he needs c-pap supplies.  Pt states they are requesting this from his PCP b/c it has been over a year since he has had a sleep study.

## 2016-11-08 NOTE — Telephone Encounter (Signed)
Written a prescription

## 2016-11-08 NOTE — Telephone Encounter (Signed)
Please review, last sleep study 01/10/05-aa

## 2016-11-09 ENCOUNTER — Other Ambulatory Visit: Payer: Self-pay

## 2016-11-09 DIAGNOSIS — G4733 Obstructive sleep apnea (adult) (pediatric): Secondary | ICD-10-CM

## 2016-11-09 NOTE — Telephone Encounter (Signed)
Order put in chart ED

## 2016-11-09 NOTE — Telephone Encounter (Signed)
Have to wait for dr Rosanna Randy to sign this-aa

## 2017-06-15 ENCOUNTER — Other Ambulatory Visit: Payer: Self-pay | Admitting: Family Medicine

## 2017-07-27 ENCOUNTER — Ambulatory Visit (INDEPENDENT_AMBULATORY_CARE_PROVIDER_SITE_OTHER): Payer: BLUE CROSS/BLUE SHIELD

## 2017-07-27 DIAGNOSIS — Z23 Encounter for immunization: Secondary | ICD-10-CM | POA: Diagnosis not present

## 2017-10-30 ENCOUNTER — Encounter: Payer: Self-pay | Admitting: Family Medicine

## 2017-10-30 ENCOUNTER — Ambulatory Visit (INDEPENDENT_AMBULATORY_CARE_PROVIDER_SITE_OTHER): Payer: BLUE CROSS/BLUE SHIELD | Admitting: Family Medicine

## 2017-10-30 VITALS — BP 110/72 | HR 82 | Temp 97.9°F | Resp 16 | Ht 71.0 in | Wt 286.0 lb

## 2017-10-30 DIAGNOSIS — Z1211 Encounter for screening for malignant neoplasm of colon: Secondary | ICD-10-CM

## 2017-10-30 DIAGNOSIS — Z0000 Encounter for general adult medical examination without abnormal findings: Secondary | ICD-10-CM | POA: Diagnosis not present

## 2017-10-30 LAB — POCT URINALYSIS DIPSTICK
BILIRUBIN UA: NEGATIVE
Blood, UA: NEGATIVE
GLUCOSE UA: NEGATIVE
KETONES UA: NEGATIVE
Leukocytes, UA: NEGATIVE
Nitrite, UA: NEGATIVE
PROTEIN UA: NEGATIVE
Spec Grav, UA: 1.02 (ref 1.010–1.025)
Urobilinogen, UA: 0.2 E.U./dL
pH, UA: 6 (ref 5.0–8.0)
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
## 2017-11-01 LAB — LIPID PANEL
Chol/HDL Ratio: 7.2 ratio — ABNORMAL HIGH (ref 0.0–5.0)
Cholesterol, Total: 222 mg/dL — ABNORMAL HIGH (ref 100–199)
HDL: 31 mg/dL — ABNORMAL LOW (ref >39)
LDL Calculated: 120 mg/dL — ABNORMAL HIGH (ref 0–99)
Triglycerides: 354 mg/dL — ABNORMAL HIGH (ref 0–149)
VLDL CHOLESTEROL CAL: 71 mg/dL — AB (ref 5–40)

## 2017-11-01 LAB — CBC WITH DIFFERENTIAL/PLATELET
BASOS: 1 %
Basophils Absolute: 0 10*3/uL (ref 0.0–0.2)
EOS (ABSOLUTE): 0.2 10*3/uL (ref 0.0–0.4)
Eos: 3 %
Hematocrit: 42.4 % (ref 37.5–51.0)
Hemoglobin: 14.1 g/dL (ref 13.0–17.7)
Immature Grans (Abs): 0 10*3/uL (ref 0.0–0.1)
Immature Granulocytes: 0 %
LYMPHS ABS: 1.5 10*3/uL (ref 0.7–3.1)
Lymphs: 24 %
MCH: 30.3 pg (ref 26.6–33.0)
MCHC: 33.3 g/dL (ref 31.5–35.7)
MCV: 91 fL (ref 79–97)
MONOS ABS: 0.3 10*3/uL (ref 0.1–0.9)
Monocytes: 5 %
NEUTROS ABS: 4.2 10*3/uL (ref 1.4–7.0)
Neutrophils: 67 %
PLATELETS: 226 10*3/uL (ref 150–379)
RBC: 4.65 x10E6/uL (ref 4.14–5.80)
RDW: 14.5 % (ref 12.3–15.4)
WBC: 6.2 10*3/uL (ref 3.4–10.8)

## 2017-11-01 LAB — COMPREHENSIVE METABOLIC PANEL
ALT: 27 IU/L (ref 0–44)
AST: 21 IU/L (ref 0–40)
Albumin/Globulin Ratio: 1.6 (ref 1.2–2.2)
Albumin: 4.4 g/dL (ref 3.6–4.8)
Alkaline Phosphatase: 104 IU/L (ref 39–117)
BILIRUBIN TOTAL: 0.5 mg/dL (ref 0.0–1.2)
BUN/Creatinine Ratio: 11 (ref 10–24)
BUN: 12 mg/dL (ref 8–27)
CALCIUM: 9.4 mg/dL (ref 8.6–10.2)
CHLORIDE: 103 mmol/L (ref 96–106)
CO2: 21 mmol/L (ref 20–29)
Creatinine, Ser: 1.06 mg/dL (ref 0.76–1.27)
GFR calc non Af Amer: 75 mL/min/{1.73_m2} (ref >59)
GFR, EST AFRICAN AMERICAN: 87 mL/min/{1.73_m2} (ref >59)
GLUCOSE: 106 mg/dL — AB (ref 65–99)
Globulin, Total: 2.7 g/dL (ref 1.5–4.5)
Potassium: 4.6 mmol/L (ref 3.5–5.2)
Sodium: 144 mmol/L (ref 134–144)
TOTAL PROTEIN: 7.1 g/dL (ref 6.0–8.5)

## 2017-11-01 LAB — TSH: TSH: 0.878 u[IU]/mL (ref 0.450–4.500)

## 2017-11-04 ENCOUNTER — Telehealth: Payer: Self-pay

## 2017-11-04 NOTE — Telephone Encounter (Signed)
-----   Message from Jerrol Banana., MD sent at 11/04/2017 12:09 PM EST ----- Lipids/trigs higher--Diet and exercise as discussed.

## 2017-11-04 NOTE — Telephone Encounter (Signed)
Patient advised as below. Patient verbalizes understanding and is in agreement with treatment plan.  

## 2018-03-12 ENCOUNTER — Other Ambulatory Visit: Payer: Self-pay

## 2018-03-14 ENCOUNTER — Other Ambulatory Visit: Payer: Self-pay | Admitting: Family Medicine

## 2018-03-14 NOTE — Telephone Encounter (Signed)
Please review for Dr. Gilbert  Thanks,   -Ardene Remley  

## 2018-04-08 ENCOUNTER — Encounter: Payer: Self-pay | Admitting: Family Medicine

## 2018-04-29 ENCOUNTER — Ambulatory Visit: Payer: Self-pay | Admitting: Family Medicine

## 2018-05-01 ENCOUNTER — Ambulatory Visit: Payer: BLUE CROSS/BLUE SHIELD | Admitting: Podiatry

## 2018-05-01 ENCOUNTER — Encounter: Payer: Self-pay | Admitting: Podiatry

## 2018-05-01 VITALS — BP 148/94 | HR 65

## 2018-05-01 DIAGNOSIS — L03032 Cellulitis of left toe: Secondary | ICD-10-CM | POA: Diagnosis not present

## 2018-05-01 NOTE — Progress Notes (Signed)
This patient presents the office with chief complaint of an infected ingrown toenail big toe left foot.  He says that it has been painful for approximately 4 months and he feels that there is pus noted in the toenail.  He says that the inside border is pink and swollen and painful.  He says he tried to cut the nail himself and he believes this infection developed.  He presents the office today for definitive evaluation and treatment of this condition.  He states that he had a similar problem years ago which was treated and the nail has never returned.   General Appearance  Alert, conversant and in no acute stress.  Vascular  Dorsalis pedis and posterior tibial  pulses are palpable  bilaterally.  Capillary return is within normal limits  bilaterally. Temperature is within normal limits  bilaterally.  Neurologic  Senn-Weinstein monofilament wire test within normal limits  bilaterally. Muscle power within normal limits bilaterally.  Nails Patient has marked incurvation noted along the medial border of the left great toenail.  Abscess formation is noted at the distal aspect of the nail bed.  Orthopedic  No limitations of motion of motion feet .  No crepitus or effusions noted.  No bony pathology or digital deformities noted.  Skin  normotropic skin with no porokeratosis noted bilaterally.  No signs of infections or ulcers noted.    Paronychia medial border left great toenail.    IE.  Nail surgery.  Treatment options and alternatives discussed.  Recommended permanent phenol matrixectomy and patient agreed.  The left hallux  was prepped with alcohol and a toe block of 3cc of 2% lidocaine plain was administered in a digital toe block. .  The toe was then prepped with betadine solution .  The offending nail border was then excised and matrix tissue exposed.  Phenol was then applied to the matrix tissue followed by an alcohol wash.  Antibiotic ointment and a dry sterile dressing was applied.  The patient was  dispensed instructions for aftercare. RTC 1 week for follow up evaluation.  Call the office as needed.     Gardiner Barefoot DPM

## 2018-05-06 ENCOUNTER — Ambulatory Visit: Payer: BLUE CROSS/BLUE SHIELD | Admitting: Family Medicine

## 2018-05-06 VITALS — BP 126/84 | HR 70 | Temp 98.4°F | Resp 16 | Wt 285.0 lb

## 2018-05-06 DIAGNOSIS — G4733 Obstructive sleep apnea (adult) (pediatric): Secondary | ICD-10-CM | POA: Diagnosis not present

## 2018-05-06 DIAGNOSIS — I10 Essential (primary) hypertension: Secondary | ICD-10-CM

## 2018-05-06 DIAGNOSIS — E785 Hyperlipidemia, unspecified: Secondary | ICD-10-CM | POA: Diagnosis not present

## 2018-05-06 DIAGNOSIS — R21 Rash and other nonspecific skin eruption: Secondary | ICD-10-CM

## 2018-05-06 DIAGNOSIS — C61 Malignant neoplasm of prostate: Secondary | ICD-10-CM | POA: Diagnosis not present

## 2018-05-06 MED ORDER — TRIAMCINOLONE ACETONIDE 0.1 % EX CREA: 1.0000 "application " | TOPICAL_CREAM | Freq: Two times a day (BID) | CUTANEOUS | 0 refills | Status: DC
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
## 2018-05-12 ENCOUNTER — Ambulatory Visit: Payer: BLUE CROSS/BLUE SHIELD | Admitting: Podiatry

## 2018-05-12 ENCOUNTER — Encounter: Payer: Self-pay | Admitting: Podiatry

## 2018-05-12 DIAGNOSIS — Z09 Encounter for follow-up examination after completed treatment for conditions other than malignant neoplasm: Secondary | ICD-10-CM

## 2018-05-12 NOTE — Progress Notes (Signed)
This patient returns to the office following nail surgery ten days  ago.  The patient says toe has been soaked and bandaged as directed.  There has been improvement of the toe since the surgery has been performed. The patient presents for continued evaluation and treatment.  GENERAL APPEARANCE: Alert, conversant. Appropriately groomed. No acute distress.  VASCULAR: Pedal pulses palpable at  [facility] and PT bilateral.  Capillary refill time is immediate to all digits,  Normal temperature gradient.    NEUROLOGIC: sensation is normal to 5.07 monofilament at 5/5 sites bilateral.  Light touch is intact bilateral, Muscle strength normal.  MUSCULOSKELETAL: acceptable muscle strength, tone and stability bilateral.  Intrinsic muscluature intact bilateral.  Rectus appearance of foot and digits noted bilateral.   DERMATOLOGIC: skin color, texture, and turgor are within normal limits.  No preulcerative lesions or ulcers  are seen, no interdigital maceration noted.   NAILS  There is necrotic tissue along the nail groove  In the absence of redness swelling and pain.  DX  S/p nail surgery  ROV  Home instructions were discussed.  Patient to call the office if there are any questions or concerns.   Gardiner Barefoot DPM

## 2018-07-11 ENCOUNTER — Other Ambulatory Visit: Payer: Self-pay | Admitting: Family Medicine

## 2018-07-26 ENCOUNTER — Ambulatory Visit (INDEPENDENT_AMBULATORY_CARE_PROVIDER_SITE_OTHER): Payer: BLUE CROSS/BLUE SHIELD

## 2018-07-26 DIAGNOSIS — Z23 Encounter for immunization: Secondary | ICD-10-CM | POA: Diagnosis not present

## 2018-09-04 ENCOUNTER — Other Ambulatory Visit: Payer: Self-pay | Admitting: Physician Assistant

## 2018-09-05 ENCOUNTER — Telehealth: Payer: Self-pay | Admitting: Family Medicine

## 2018-09-05 NOTE — Telephone Encounter (Signed)
Rx has been sent to the pharmacy

## 2018-09-05 NOTE — Telephone Encounter (Signed)
Pt calling to check on status of medication requested.  Thanks, [employer]

## 2018-09-15 ENCOUNTER — Other Ambulatory Visit: Payer: Self-pay

## 2018-09-15 MED ORDER — SILDENAFIL CITRATE 20 MG PO TABS: ORAL_TABLET | ORAL | 0 refills | Status: AC

## 2018-09-15 NOTE — Telephone Encounter (Signed)
Patient is requesting a refill on sildenafil (REVATIO) 20 MG tablet be sent to [facility] pharmacy. Patient requested the refill last week be sent to [facility], but he was unaware of the pharmacy closing. CB#[phone]

## 2018-11-06 ENCOUNTER — Encounter: Payer: Self-pay | Admitting: Family Medicine

## 2018-11-06 ENCOUNTER — Ambulatory Visit (INDEPENDENT_AMBULATORY_CARE_PROVIDER_SITE_OTHER): Payer: BLUE CROSS/BLUE SHIELD | Admitting: Family Medicine

## 2018-11-06 ENCOUNTER — Other Ambulatory Visit: Payer: Self-pay

## 2018-11-06 VITALS — BP 108/82 | HR 83 | Temp 98.4°F | Ht 71.0 in | Wt 277.8 lb

## 2018-11-06 DIAGNOSIS — I10 Essential (primary) hypertension: Secondary | ICD-10-CM

## 2018-11-06 DIAGNOSIS — G4733 Obstructive sleep apnea (adult) (pediatric): Secondary | ICD-10-CM | POA: Diagnosis not present

## 2018-11-06 DIAGNOSIS — Z0000 Encounter for general adult medical examination without abnormal findings: Secondary | ICD-10-CM | POA: Diagnosis not present

## 2018-11-06 DIAGNOSIS — Z6838 Body mass index (BMI) 38.0-38.9, adult: Secondary | ICD-10-CM

## 2018-11-06 DIAGNOSIS — E785 Hyperlipidemia, unspecified: Secondary | ICD-10-CM | POA: Diagnosis not present

## 2018-11-06 DIAGNOSIS — E66812 Obesity, class 2: Secondary | ICD-10-CM
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
## 2018-11-10 ENCOUNTER — Telehealth: Payer: Self-pay | Admitting: Family Medicine

## 2018-11-10 NOTE — Telephone Encounter (Signed)
Patient states he was here last week for a CPE and Dr. Rosanna Randy was to send in Generic Flexeril to [facility].   [facility] does not have a RX for this.   Can you send the RX in please.

## 2018-11-11 ENCOUNTER — Other Ambulatory Visit: Payer: Self-pay | Admitting: Family Medicine

## 2018-11-11 LAB — COMPREHENSIVE METABOLIC PANEL
ALBUMIN: 4.3 g/dL (ref 3.8–4.8)
ALK PHOS: 115 IU/L (ref 39–117)
ALT: 27 IU/L (ref 0–44)
AST: 18 IU/L (ref 0–40)
Albumin/Globulin Ratio: 1.6 (ref 1.2–2.2)
BUN / CREAT RATIO: 11 (ref 10–24)
BUN: 12 mg/dL (ref 8–27)
Bilirubin Total: 0.4 mg/dL (ref 0.0–1.2)
CALCIUM: 9.1 mg/dL (ref 8.6–10.2)
CO2: 20 mmol/L (ref 20–29)
CREATININE: 1.1 mg/dL (ref 0.76–1.27)
Chloride: 104 mmol/L (ref 96–106)
GFR calc Af Amer: 82 mL/min/{1.73_m2} (ref >59)
GFR calc non Af Amer: 71 mL/min/{1.73_m2} (ref >59)
GLUCOSE: 94 mg/dL (ref 65–99)
Globulin, Total: 2.7 g/dL (ref 1.5–4.5)
Potassium: 4.6 mmol/L (ref 3.5–5.2)
Sodium: 142 mmol/L (ref 134–144)
Total Protein: 7 g/dL (ref 6.0–8.5)

## 2018-11-11 LAB — CBC WITH DIFFERENTIAL/PLATELET
BASOS ABS: 0 10*3/uL (ref 0.0–0.2)
Basos: 1 %
EOS (ABSOLUTE): 0.2 10*3/uL (ref 0.0–0.4)
Eos: 4 %
HEMOGLOBIN: 14.2 g/dL (ref 13.0–17.7)
Hematocrit: 41.8 % (ref 37.5–51.0)
IMMATURE GRANULOCYTES: 1 %
Immature Grans (Abs): 0 10*3/uL (ref 0.0–0.1)
LYMPHS ABS: 1.5 10*3/uL (ref 0.7–3.1)
LYMPHS: 25 %
MCH: 30.3 pg (ref 26.6–33.0)
MCHC: 34 g/dL (ref 31.5–35.7)
MCV: 89 fL (ref 79–97)
MONOCYTES: 6 %
Monocytes Absolute: 0.4 10*3/uL (ref 0.1–0.9)
NEUTROS PCT: 63 %
Neutrophils Absolute: 3.9 10*3/uL (ref 1.4–7.0)
Platelets: 250 10*3/uL (ref 150–450)
RBC: 4.69 x10E6/uL (ref 4.14–5.80)
RDW: 13.1 % (ref 11.6–15.4)
WBC: 6.1 10*3/uL (ref 3.4–10.8)

## 2018-11-11 LAB — LIPID PANEL
CHOLESTEROL TOTAL: 221 mg/dL — AB (ref 100–199)
Chol/HDL Ratio: 6.5 ratio — ABNORMAL HIGH (ref 0.0–5.0)
HDL: 34 mg/dL — ABNORMAL LOW (ref >39)
LDL CALC: 135 mg/dL — AB (ref 0–99)
TRIGLYCERIDES: 261 mg/dL — AB (ref 0–149)
VLDL CHOLESTEROL CAL: 52 mg/dL — AB (ref 5–40)

## 2018-11-11 LAB — TSH: TSH: 0.771 u[IU]/mL (ref 0.450–4.500)

## 2018-11-11 MED ORDER — CYCLOBENZAPRINE HCL 10 MG PO TABS: 10.0000 mg | ORAL_TABLET | Freq: Three times a day (TID) | ORAL | 3 refills | Status: DC | PRN

## 2018-11-11 NOTE — Telephone Encounter (Signed)
Cyclobenzaprine 10mg  q 8 hours prn,#30,3rf

## 2018-11-11 NOTE — Telephone Encounter (Signed)
Please review.Ok to send?

## 2018-11-11 NOTE — Telephone Encounter (Signed)
Sent in medication as below.  

## 2018-11-11 NOTE — Telephone Encounter (Signed)
yes

## 2018-12-01 ENCOUNTER — Telehealth: Payer: Self-pay | Admitting: Family Medicine

## 2018-12-01 NOTE — Telephone Encounter (Signed)
pt called saying he is waiting to hear back about getting a new c pap machine.  CB#  [phone]  Thanks t eri

## 2018-12-01 NOTE — Telephone Encounter (Signed)
Pt returned missed call to [place] [facility] [place].  Please call pt back at [phone].  Thanks, [employer]

## 2018-12-01 NOTE — Telephone Encounter (Signed)
I don't know anything about this.  Do you remember if something was supposed to be done when he was last seen?

## 2018-12-01 NOTE — Telephone Encounter (Signed)
Have obtained his last sleep study and it was last done in 2006.  They will not provide a new CPAP unless sleep study is less than 64 years old.  Will need to obtain a new study  LMTCB

## 2018-12-02 ENCOUNTER — Other Ambulatory Visit: Payer: Self-pay

## 2018-12-02 ENCOUNTER — Telehealth: Payer: Self-pay | Admitting: Family Medicine

## 2018-12-02 DIAGNOSIS — G4733 Obstructive sleep apnea (adult) (pediatric): Secondary | ICD-10-CM

## 2018-12-02 NOTE — Telephone Encounter (Signed)
Order for home sleep study faxed to [facility]

## 2018-12-02 NOTE — Progress Notes (Signed)
ref

## 2018-12-09 ENCOUNTER — Encounter: Payer: Self-pay | Admitting: Family Medicine

## 2018-12-10 ENCOUNTER — Encounter: Payer: Self-pay | Admitting: Family Medicine

## 2018-12-30 ENCOUNTER — Telehealth: Payer: Self-pay | Admitting: Family Medicine

## 2018-12-30 NOTE — Telephone Encounter (Signed)
Pt called regarding a sleep study that he had done March 17th and about the report.  He would like [place] to call him back  CB#  [phone]  Thanks teri

## 2018-12-31 NOTE — Telephone Encounter (Signed)
Please review

## 2018-12-31 NOTE — Telephone Encounter (Signed)
I am fine with nasal pillows.

## 2018-12-31 NOTE — Telephone Encounter (Signed)
Advised patient as below.  

## 2018-12-31 NOTE — Telephone Encounter (Signed)
Spoke with the patient, and he reports that he had the sleep study done at home. Jose Lewis told him that we recommended that he wear a full face mask. He reports that he does not do well with a full face mask for his CPAP machine. He prefers the nasal pillows instead.   He is wanting to know did his test tell us something is to why the full face mask would be needed (for example, symptoms have worsened)? He reports that he has used nasal pillows for 12 years, and would like to continue using them if Jose Lewis agrees. Please review. Thanks!

## 2018-12-31 NOTE — Telephone Encounter (Signed)
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

## 2019-02-04 ENCOUNTER — Ambulatory Visit (INDEPENDENT_AMBULATORY_CARE_PROVIDER_SITE_OTHER): Payer: BLUE CROSS/BLUE SHIELD | Admitting: Family Medicine

## 2019-02-04 ENCOUNTER — Ambulatory Visit: Payer: Self-pay | Admitting: Family Medicine

## 2019-02-04 ENCOUNTER — Other Ambulatory Visit: Payer: Self-pay

## 2019-02-04 ENCOUNTER — Encounter: Payer: Self-pay | Admitting: Family Medicine

## 2019-02-04 VITALS — BP 130/88 | HR 75 | Temp 97.7°F | Resp 18 | Wt 277.4 lb

## 2019-02-04 DIAGNOSIS — E785 Hyperlipidemia, unspecified: Secondary | ICD-10-CM

## 2019-02-04 DIAGNOSIS — I10 Essential (primary) hypertension: Secondary | ICD-10-CM | POA: Diagnosis not present

## 2019-02-04 DIAGNOSIS — Z6838 Body mass index (BMI) 38.0-38.9, adult: Secondary | ICD-10-CM

## 2019-02-04 DIAGNOSIS — H00012 Hordeolum externum right lower eyelid: Secondary | ICD-10-CM | POA: Diagnosis not present

## 2019-02-04 DIAGNOSIS — G4733 Obstructive sleep apnea (adult) (pediatric): Secondary | ICD-10-CM

## 2019-02-04 NOTE — Progress Notes (Signed)
Patient: Jose Lewis Male    DOB: 1956-02-26   63 y.o.   MRN: [id number] Visit Date: 02/04/2019  Today's Provider: [name], MD   Chief Complaint  Patient presents with  . Follow-up   Subjective:    HPI  3 Month Follow Up CPAP machine usage. Patient states the he is sleeping well. Small stye in right eye that has been there for 6 weeks.  He has been using CPAP all night for 5 weeks and states that he feels better.  I think he will also get medical benefit from it long-term.   Hypertension, follow-up:  BP Readings from Last 3 Encounters:  11/06/18 108/82  05/06/18 126/84  05/01/18 (!) 148/94    He was last seen for hypertension 3 months ago.  BP at that visit was 108/82. Management since that visit includes none.He reports good compliance with treatment. He is not having side effects.  He is exercising. He is adherent to low salt diet.   Outside blood pressures are . He is experiencing none.  Patient denies none.   Cardiovascular risk factors include none.  Use of agents associated with hypertension: none.   ------------------------------------------------------------------------    Lipid/Cholesterol, Follow-up:   Last seen for this 3 months ago.  Management since that visit includes none.  Last Lipid Panel:    Component Value Date/Time   CHOL 221 (H) 11/10/2018 0916   TRIG 261 (H) 11/10/2018 0916   HDL 34 (L) 11/10/2018 0916   CHOLHDL 6.5 (H) 11/10/2018 0916   LDLCALC 135 (H) 11/10/2018 2951    He reports good compliance with treatment. He is not having side effects.   Wt Readings from Last 3 Encounters:  02/04/19 277 lb 6.4 oz (125.8 kg)  11/06/18 277 lb 12.8 oz (126 kg)  05/06/18 285 lb (129.3 kg)    ------------------------------------------------------------------------    No Known Allergies   Current Outpatient Medications:  .  amLODipine-benazepril (LOTREL) 10-20 MG capsule, TAKE 1 CAPSULE BY MOUTH ONCE DAILY, Disp:  30 capsule, Rfl: 11 .  aspirin EC 81 MG tablet, Take 81 mg by mouth at bedtime. Reported on 03/01/2016, Disp: , Rfl:  .  Glucosamine-Chondroitin (OSTEO BI-FLEX REGULAR STRENGTH) 250-200 MG TABS, OSTEO BI-FLEX REGULAR STRENGTH, 250-200MG  (Oral Tablet)  1 Every Day for 0 days  Quantity: 0.00;  Refills: 0   Ordered :27-Dec-2010  [name] ;  Started 29-Nov-2008 Active, Disp: , Rfl:  .  meloxicam (MOBIC) 15 MG tablet, meloxicam 15 mg tablet  Take 1 tablet every day by oral route with meals., Disp: , Rfl:  .  omeprazole (PRILOSEC) 20 MG capsule, Take 20 mg by mouth daily. Patient takes BRAND NAME PRILOSEC ONLY , Disp: , Rfl:  .  Probiotic Product (PROBIOTIC-10 PO), Take by mouth daily., Disp: , Rfl:  .  sildenafil (REVATIO) 20 MG tablet, TAKE THREE TO FIVE TABLETS BY MOUTH DAILY AS NEEDED., Disp: 90 tablet, Rfl: 0 .  cyclobenzaprine (FLEXERIL) 10 MG tablet, Take 1 tablet (10 mg total) by mouth 3 (three) times daily as needed for muscle spasms. (Patient not taking: Reported on 02/04/2019), Disp: 30 tablet, Rfl: 3 .  cyclobenzaprine (FLEXERIL) 10 MG tablet, Take 1 tablet (10 mg total) by mouth every 8 (eight) hours as needed for muscle spasms. (Patient not taking: Reported on 02/04/2019), Disp: 30 tablet, Rfl: 3 .  Levocetirizine Dihydrochloride (XYZAL ALLERGY 24HR PO), Take by mouth daily as needed., Disp: , Rfl:  .  loratadine (CLARITIN)  10 MG tablet, Take 1 tablet (10 mg total) by mouth daily. (Patient not taking: Reported on 02/04/2019), Disp: 30 tablet, Rfl: 11 .  meclizine (ANTIVERT) 25 MG tablet, Take by mouth. Reported on 03/01/2016, Disp: , Rfl:  .  triamcinolone cream (KENALOG) 0.1 %, Apply 1 application topically 2 (two) times daily. (Patient not taking: Reported on 11/06/2018), Disp: 30 g, Rfl: 0 .  vitamin E 1000 UNIT capsule, Take 1,000 Units by mouth every other day., Disp: , Rfl:   Review of Systems  Constitutional: Negative.   Eyes: Positive for redness.  Respiratory: Negative.    Genitourinary: Negative.   Allergic/Immunologic: Negative.   Neurological: Negative.   Psychiatric/Behavioral: Negative.   All other systems reviewed and are negative.   Social History   Tobacco Use  . Smoking status: Never Smoker  . Smokeless tobacco: Never Used  Substance Use Topics  . Alcohol use: Yes    Comment: occasional drink      Objective:   Wt 277 lb 6.4 oz (125.8 kg)   BMI 38.69 kg/m  Vitals:   02/04/19 1501  Weight: 277 lb 6.4 oz (125.8 kg)     Physical Exam Vitals signs reviewed.  Constitutional:      Appearance: He is well-developed. He is obese.     Comments: Obese WM NAD.  HENT:     Head: Normocephalic and atraumatic.     Right Ear: External ear normal.     Left Ear: External ear normal.     Nose: Nose normal.  Eyes:     General: No scleral icterus.    Conjunctiva/sclera: Conjunctivae normal.     Comments: Small stye on middle of right lower lid.  Neck:     Thyroid: No thyromegaly.  Cardiovascular:     Rate and Rhythm: Normal rate and regular rhythm.     Heart sounds: Normal heart sounds.  Pulmonary:     Effort: Pulmonary effort is normal.  Abdominal:     Palpations: Abdomen is soft.  Genitourinary:    Penis: Normal.      Prostate: Normal.     Rectum: Normal.  Musculoskeletal: Normal range of motion.     Comments: Trace LE edema.  Skin:    General: Skin is warm and dry.  Neurological:     General: No focal deficit present.     Mental Status: He is alert and oriented to person, place, and time. Mental status is at baseline.  Psychiatric:        Mood and Affect: Mood normal.        Behavior: Behavior normal.        Thought Content: Thought content normal.        Judgment: Judgment normal.         Assessment & Plan    1. OSA (obstructive sleep apnea) Patient using CPAP nightly with benefit.  2. Hordeolum externum of right lower eyelid Use topical hot compresses.  3. Essential (primary) hypertension   4. Class 2 severe  obesity due to excess calories with serious comorbidity and body mass index (BMI) of 38.0 to 38.9 in adult [facility]) With hypertension and OSA.  5. Hyperlipidemia, unspecified hyperlipidemia type      [name], MD  [facility]

## 2019-07-06 ENCOUNTER — Other Ambulatory Visit: Payer: Self-pay | Admitting: Family Medicine

## 2019-07-06 NOTE — Telephone Encounter (Signed)
Pharmacy requesting refills. Thanks!  

## 2019-08-10 ENCOUNTER — Ambulatory Visit: Payer: Self-pay | Admitting: Family Medicine

## 2019-08-12 ENCOUNTER — Ambulatory Visit: Payer: Self-pay | Admitting: Family Medicine

## 2019-11-30 ENCOUNTER — Telehealth: Payer: Self-pay

## 2019-11-30 NOTE — Telephone Encounter (Signed)
Copied from [place] [id number]. Topic: General - Other >> Nov 30, 2019  2:36 PM Antonieta Iba C wrote: Reason for CRM: pt called in to ask if he could have labs completed before his apt. Pt would like to have results for apt.   Please assist.    CB: [phone] -cell

## 2019-11-30 NOTE — Telephone Encounter (Signed)
Patient advised that labs are not usually decided until appointment for physicals.

## 2019-12-08 ENCOUNTER — Encounter: Payer: BLUE CROSS/BLUE SHIELD | Admitting: Family Medicine

## 2019-12-09 ENCOUNTER — Ambulatory Visit (INDEPENDENT_AMBULATORY_CARE_PROVIDER_SITE_OTHER): Payer: Medicare Other | Admitting: Family Medicine

## 2019-12-09 ENCOUNTER — Encounter: Payer: Self-pay | Admitting: Family Medicine

## 2019-12-09 ENCOUNTER — Other Ambulatory Visit: Payer: Self-pay

## 2019-12-09 VITALS — BP 122/73 | HR 76 | Temp 97.6°F | Resp 16 | Ht 71.0 in | Wt 277.0 lb

## 2019-12-09 DIAGNOSIS — K429 Umbilical hernia without obstruction or gangrene: Secondary | ICD-10-CM | POA: Diagnosis not present

## 2019-12-09 DIAGNOSIS — Z6838 Body mass index (BMI) 38.0-38.9, adult: Secondary | ICD-10-CM

## 2019-12-09 DIAGNOSIS — Z0000 Encounter for general adult medical examination without abnormal findings: Secondary | ICD-10-CM | POA: Diagnosis not present

## 2019-12-09 DIAGNOSIS — M545 Low back pain, unspecified: Secondary | ICD-10-CM

## 2019-12-09 DIAGNOSIS — G4733 Obstructive sleep apnea (adult) (pediatric): Secondary | ICD-10-CM

## 2019-12-09 DIAGNOSIS — K219 Gastro-esophageal reflux disease without esophagitis: Secondary | ICD-10-CM

## 2019-12-09 DIAGNOSIS — E785 Hyperlipidemia, unspecified: Secondary | ICD-10-CM

## 2019-12-09 DIAGNOSIS — I10 Essential (primary) hypertension: Secondary | ICD-10-CM

## 2019-12-09 DIAGNOSIS — C61 Malignant neoplasm of prostate: Secondary | ICD-10-CM | POA: Diagnosis not present
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
## 2020-01-07 ENCOUNTER — Encounter: Payer: Self-pay | Admitting: Podiatry

## 2020-01-07 ENCOUNTER — Other Ambulatory Visit: Payer: Self-pay

## 2020-01-07 ENCOUNTER — Ambulatory Visit (INDEPENDENT_AMBULATORY_CARE_PROVIDER_SITE_OTHER): Payer: Medicare Other | Admitting: Podiatry

## 2020-01-07 VITALS — Temp 97.2°F

## 2020-01-07 DIAGNOSIS — L03032 Cellulitis of left toe: Secondary | ICD-10-CM | POA: Diagnosis not present

## 2020-01-07 NOTE — Progress Notes (Signed)
Patient presents to the office with chief complaint of painful ingrowing toenail left big toe.  Patient says he has nail surgery 2 years nago and now the nail is causing pain at the inside nail border.  He says his left toe is painful to the touch.  It has been painful about 3 months,   He presents for evaluation and treatment of painful nail border.  Vascular  Dorsalis pedis and posterior tibial pulses are palpable  B/L.  Capillary return  WNL.  Temperature gradient is  WNL.  Skin turgor  WNL  Sensorium  Senn Weinstein monofilament wire  WNL. Normal tactile sensation.  Nail Exam  Patient has normal nails .  Marked incurvation noted medial border left great toe.  Mild redness noted in absence of pus or drainage.  Orthopedic  Exam  Muscle tone and muscle strength  WNL.  No limitations of motion feet  B/L.  No crepitus or joint effusion noted.  Foot type is unremarkable and digits show no abnormalities.  Bony prominences are unremarkable.  Skin  No open lesions.  Normal skin texture and turgor.  Nail spicule medial border left hallux.  Paronychia medial border left hallux.  ROV.  Resection nail spicule.  H2O2 washes this weekend.  RTC prn.  Gardiner Barefoot DPM
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
## 2020-01-11 ENCOUNTER — Ambulatory Visit
Admission: RE | Admit: 2020-01-11 | Discharge: 2020-01-11 | Disposition: A | Discharge status: Home / Self Care | Payer: Medicare Other | Source: Ambulatory Visit | Attending: Urology | Admitting: Urology

## 2020-01-11 ENCOUNTER — Ambulatory Visit (INDEPENDENT_AMBULATORY_CARE_PROVIDER_SITE_OTHER): Payer: Medicare Other | Admitting: Urology

## 2020-01-11 ENCOUNTER — Encounter: Payer: Self-pay | Admitting: Urology

## 2020-01-11 ENCOUNTER — Other Ambulatory Visit: Payer: Self-pay

## 2020-01-11 VITALS — BP 148/90 | HR 77 | Ht 72.0 in | Wt 275.0 lb

## 2020-01-11 DIAGNOSIS — N200 Calculus of kidney: Secondary | ICD-10-CM

## 2020-01-11 DIAGNOSIS — Z8546 Personal history of malignant neoplasm of prostate: Secondary | ICD-10-CM | POA: Diagnosis not present

## 2020-01-11 DIAGNOSIS — N5082 Scrotal pain: Secondary | ICD-10-CM | POA: Insufficient documentation

## 2020-01-12 ENCOUNTER — Other Ambulatory Visit: Payer: Self-pay | Admitting: Urology

## 2020-01-12 ENCOUNTER — Telehealth: Payer: Self-pay | Admitting: *Deleted

## 2020-01-12 DIAGNOSIS — N5082 Scrotal pain: Secondary | ICD-10-CM

## 2020-01-12 NOTE — Telephone Encounter (Signed)
Notified patient as instructed, patient pleased. Discussed follow-up appointments, patient agrees  

## 2020-01-12 NOTE — Telephone Encounter (Signed)
-----   Message from Abbie Sons, MD sent at 01/12/2020  1:35 PM EDT ----- KUB reviewed and stable left lower pole calculi are noted.  Order for a scrotal ultrasound was entered as per yesterday's office note.  Will call with results.

## 2020-01-18 NOTE — Progress Notes (Signed)
Established patient visit   Patient: Jose Lewis   DOB: September 11, 1956   64 y.o. Male  MRN: [id number] Visit Date: 01/21/2020  Today's healthcare provider: [name], MD   Chief Complaint  Patient presents with  . Hypertension   Subjective    HPI Patient is concerned about blood pressure.  His blood pressure seem to be running a little high in recent times.  He is on Lotrel 10/20. He has no history of atherosclerotic cardiovascular disease and is taking aspirin daily for the last 20 years.  He also asked about this. He has finished physical therapy and his back is feeling better.  He has seen urology and his testicular discomfort is improving.  Hypertension, follow-up  BP Readings from Last 3 Encounters:  01/21/20 126/82  01/11/20 (!) 148/90  12/09/19 122/73   He was last seen for hypertension 1 months ago.  BP at that visit was 122/73. Management since that visit includes; Stable. He reports good compliance with treatment. He is not having side effects.  He is not exercising. He is not adherent to low salt diet.   Outside blood pressures are 131/86 & 30 minutes later 127/80 (4/26).  He does not smoke.  Use of agents associated with hypertension: none.   -----------------------------------------------------------------------------------------        Medications: Outpatient Medications Prior to Visit  Medication Sig  . amLODipine-benazepril (LOTREL) 10-20 MG capsule TAKE 1 CAPSULE BY MOUTH ONCE DAILY  . aspirin EC 81 MG tablet Take 81 mg by mouth at bedtime. Reported on 03/01/2016  . Glucosamine-Chondroitin (OSTEO BI-FLEX REGULAR STRENGTH) 250-200 MG TABS OSTEO BI-FLEX REGULAR STRENGTH, 250-200MG  (Oral Tablet)  1 Every Day for 0 days  Quantity: 0.00;  Refills: 0   Ordered :27-Dec-2010  [name] ;  Started 29-Nov-2008 Active  . meclizine (ANTIVERT) 25 MG tablet Take by mouth. Reported on 03/01/2016  . meloxicam (MOBIC) 15 MG tablet meloxicam  15 mg tablet  Take 1 tablet every day by oral route with meals.  [name] omeprazole (PRILOSEC) 20 MG capsule Take 20 mg by mouth daily. Patient takes BRAND NAME [place]   . Probiotic Product (PROBIOTIC-10 PO) Take by mouth daily.  . sildenafil (REVATIO) 20 MG tablet TAKE THREE TO FIVE TABLETS BY MOUTH DAILY AS NEEDED.  [name] Levocetirizine Dihydrochloride (XYZAL ALLERGY 24HR PO) Take by mouth daily as needed.  . vitamin E 1000 UNIT capsule Take 1,000 Units by mouth every other day.   No facility-administered medications prior to visit.    Review of Systems  Constitutional: Negative for appetite change, chills and fever.  Eyes: Negative.   Respiratory: Negative for chest tightness, shortness of breath and wheezing.   Cardiovascular: Negative for chest pain and palpitations.  Gastrointestinal: Negative for abdominal pain, nausea and vomiting.  Endocrine: Negative.   Genitourinary: Positive for testicular pain.  Musculoskeletal: Positive for back pain.  Allergic/Immunologic: Negative.   Neurological: Negative.   Hematological: Negative.   Psychiatric/Behavioral: Negative.     Last lipids Lab Results  Component Value Date   CHOL 221 (H) 11/10/2018   HDL 34 (L) 11/10/2018   LDLCALC 135 (H) 11/10/2018   TRIG 261 (H) 11/10/2018   CHOLHDL 6.5 (H) 11/10/2018       Objective    BP 126/82 (BP Location: Right Arm, Patient Position: Sitting, Cuff Size: Large)   Pulse 73   Temp (!) 97.1 F (36.2 C) (Temporal)   Ht 6' (1.829 m)   Wt 285 lb 6.4  oz (129.5 kg)   BMI 38.71 kg/m  BP Readings from Last 3 Encounters:  01/21/20 126/82  01/11/20 (!) 148/90  12/09/19 122/73   Wt Readings from Last 3 Encounters:  01/21/20 285 lb 6.4 oz (129.5 kg)  01/11/20 275 lb (124.7 kg)  12/09/19 277 lb (125.6 kg)      Physical Exam Vitals reviewed.  Constitutional:      Appearance: He is obese.  HENT:     Head: Normocephalic and atraumatic.     Nose: Nose normal.  Eyes:     General: No  scleral icterus.    Conjunctiva/sclera: Conjunctivae normal.  Neck:     Vascular: No carotid bruit.  Cardiovascular:     Rate and Rhythm: Normal rate and regular rhythm.     Pulses: Normal pulses.     Heart sounds: Normal heart sounds.  Pulmonary:     Effort: Pulmonary effort is normal.     Breath sounds: Normal breath sounds.  Lymphadenopathy:     Cervical: No cervical adenopathy.  Skin:    General: Skin is warm and dry.  Neurological:     General: No focal deficit present.     Mental Status: He is alert and oriented to person, place, and time.  Psychiatric:        Mood and Affect: Mood normal.        Behavior: Behavior normal.        Thought Content: Thought content normal.        Judgment: Judgment normal.       No results found for any visits on 01/21/20.  Assessment & Plan     1. Essential hypertension At this time increase Lotrel to 10/40 daily.  Return to clinic 1 to 2 months - PSA - CBC with Differential/Platelet - Comprehensive metabolic panel - TSH - Lipid panel - amLODipine-benazepril (LOTREL) 10-40 MG capsule; Take 1 capsule by mouth daily.  Dispense: 30 capsule; Refill: 11  2. Prostate cancer (National) Follow PSA.  Followed by Dr. Bernardo Heater - PSA - CBC with Differential/Platelet - Comprehensive metabolic panel - TSH - Lipid panel  3. Hyperlipidemia, unspecified hyperlipidemia type  - PSA - CBC with Differential/Platelet - Comprehensive metabolic panel - TSH - Lipid panel  4. Need for 23-polyvalent pneumococcal polysaccharide vaccine  - Pneumococcal polysaccharide vaccine 23-valent greater than or equal to 2yo subcutaneous/IM  5. Routine health maintenance Stop daily aspirin with no established cardiovascular disease. PHQ-9 on next visit for follow-up.  Covid pandemic and downturn in business really bothered the patient.  We will follow up on next visit. 6.  Right-sided low back pain Musculoskeletal and improved with physical therapy 7.   Obesity Need for weight  loss through diet and exercise as discussed No follow-ups on file.         Kristiann Noyce Cranford Mon, MD  [facility] [phone] (phone) [phone] (fax)  [place]

## 2020-01-20 ENCOUNTER — Other Ambulatory Visit: Payer: Self-pay

## 2020-01-20 ENCOUNTER — Ambulatory Visit
Admission: RE | Admit: 2020-01-20 | Discharge: 2020-01-20 | Disposition: A | Discharge status: Home / Self Care | Payer: Medicare Other | Source: Ambulatory Visit | Attending: Urology | Admitting: Urology

## 2020-01-20 DIAGNOSIS — N5082 Scrotal pain: Secondary | ICD-10-CM

## 2020-01-21 ENCOUNTER — Ambulatory Visit (INDEPENDENT_AMBULATORY_CARE_PROVIDER_SITE_OTHER): Payer: Medicare Other | Admitting: Family Medicine

## 2020-01-21 ENCOUNTER — Encounter: Payer: Self-pay | Admitting: Family Medicine

## 2020-01-21 ENCOUNTER — Other Ambulatory Visit: Payer: Self-pay

## 2020-01-21 VITALS — BP 126/82 | HR 73 | Temp 97.1°F | Ht 72.0 in | Wt 285.4 lb

## 2020-01-21 DIAGNOSIS — I10 Essential (primary) hypertension: Secondary | ICD-10-CM | POA: Diagnosis not present

## 2020-01-21 DIAGNOSIS — E785 Hyperlipidemia, unspecified: Secondary | ICD-10-CM | POA: Diagnosis not present

## 2020-01-21 DIAGNOSIS — M545 Low back pain, unspecified: Secondary | ICD-10-CM

## 2020-01-21 DIAGNOSIS — Z23 Encounter for immunization: Secondary | ICD-10-CM

## 2020-01-21 DIAGNOSIS — Z0000 Encounter for general adult medical examination without abnormal findings: Secondary | ICD-10-CM

## 2020-01-21 DIAGNOSIS — Z6838 Body mass index (BMI) 38.0-38.9, adult: Secondary | ICD-10-CM

## 2020-01-21 DIAGNOSIS — C61 Malignant neoplasm of prostate: Secondary | ICD-10-CM

## 2020-01-21 MED ORDER — AMLODIPINE BESY-BENAZEPRIL HCL 10-40 MG PO CAPS: 1.0000 | ORAL_CAPSULE | Freq: Every day | ORAL | 11 refills | Status: DC

## 2020-01-21 NOTE — Patient Instructions (Signed)
STOP ASPIRIN

## 2020-01-25 ENCOUNTER — Telehealth: Payer: Self-pay | Admitting: *Deleted

## 2020-01-25 NOTE — Telephone Encounter (Signed)
-----   Message from Abbie Sons, MD sent at 01/24/2020 11:16 AM EDT ----- No significant abnormalities on scrotal ultrasound.  No findings identified that would be a cause of his pain.

## 2020-01-26 LAB — COMPREHENSIVE METABOLIC PANEL
ALT: 28 IU/L (ref 0–44)
AST: 19 IU/L (ref 0–40)
Albumin/Globulin Ratio: 1.8 (ref 1.2–2.2)
Albumin: 4.4 g/dL (ref 3.8–4.8)
Alkaline Phosphatase: 106 IU/L (ref 39–117)
BUN/Creatinine Ratio: 12 (ref 10–24)
BUN: 15 mg/dL (ref 8–27)
Bilirubin Total: 0.5 mg/dL (ref 0.0–1.2)
CO2: 20 mmol/L (ref 20–29)
Calcium: 9.5 mg/dL (ref 8.6–10.2)
Chloride: 104 mmol/L (ref 96–106)
Creatinine, Ser: 1.28 mg/dL — ABNORMAL HIGH (ref 0.76–1.27)
GFR calc Af Amer: 67 mL/min/{1.73_m2} (ref >59)
GFR calc non Af Amer: 58 mL/min/{1.73_m2} — ABNORMAL LOW (ref >59)
Globulin, Total: 2.5 g/dL (ref 1.5–4.5)
Glucose: 112 mg/dL — ABNORMAL HIGH (ref 65–99)
Potassium: 4.6 mmol/L (ref 3.5–5.2)
Sodium: 141 mmol/L (ref 134–144)
Total Protein: 6.9 g/dL (ref 6.0–8.5)

## 2020-01-26 LAB — CBC WITH DIFFERENTIAL/PLATELET
Basophils Absolute: 0.1 10*3/uL (ref 0.0–0.2)
Basos: 1 %
EOS (ABSOLUTE): 0.2 10*3/uL (ref 0.0–0.4)
Eos: 2 %
Hematocrit: 43 % (ref 37.5–51.0)
Hemoglobin: 14.8 g/dL (ref 13.0–17.7)
Immature Grans (Abs): 0 10*3/uL (ref 0.0–0.1)
Immature Granulocytes: 1 %
Lymphocytes Absolute: 1.5 10*3/uL (ref 0.7–3.1)
Lymphs: 24 %
MCH: 31.3 pg (ref 26.6–33.0)
MCHC: 34.4 g/dL (ref 31.5–35.7)
MCV: 91 fL (ref 79–97)
Monocytes Absolute: 0.4 10*3/uL (ref 0.1–0.9)
Monocytes: 6 %
Neutrophils Absolute: 4.3 10*3/uL (ref 1.4–7.0)
Neutrophils: 66 %
Platelets: 219 10*3/uL (ref 150–450)
RBC: 4.73 x10E6/uL (ref 4.14–5.80)
RDW: 13.1 % (ref 11.6–15.4)
WBC: 6.4 10*3/uL (ref 3.4–10.8)

## 2020-01-26 LAB — LIPID PANEL
Chol/HDL Ratio: 6.9 ratio — ABNORMAL HIGH (ref 0.0–5.0)
Cholesterol, Total: 229 mg/dL — ABNORMAL HIGH (ref 100–199)
HDL: 33 mg/dL — ABNORMAL LOW (ref >39)
LDL Chol Calc (NIH): 132 mg/dL — ABNORMAL HIGH (ref 0–99)
Triglycerides: 353 mg/dL — ABNORMAL HIGH (ref 0–149)
VLDL Cholesterol Cal: 64 mg/dL — ABNORMAL HIGH (ref 5–40)

## 2020-01-26 LAB — TSH: TSH: 1.92 u[IU]/mL (ref 0.450–4.500)

## 2020-01-26 LAB — PSA: Prostate Specific Ag, Serum: <0.1 ng/mL (ref 0.0–4.0)

## 2020-01-28 ENCOUNTER — Telehealth: Payer: Self-pay

## 2020-01-28 NOTE — Telephone Encounter (Signed)
Patient advised.

## 2020-01-28 NOTE — Telephone Encounter (Signed)
-----   Message from Jerrol Banana., MD sent at 01/28/2020 10:51 AM EDT ----- Labs in normal range.  Push fluids a little to protect kidneys.

## 2020-03-15 ENCOUNTER — Other Ambulatory Visit: Payer: Self-pay

## 2020-03-15 ENCOUNTER — Encounter: Payer: Self-pay | Admitting: Podiatry

## 2020-03-15 ENCOUNTER — Ambulatory Visit (INDEPENDENT_AMBULATORY_CARE_PROVIDER_SITE_OTHER): Payer: Medicare Other | Admitting: Podiatry

## 2020-03-15 DIAGNOSIS — L603 Nail dystrophy: Secondary | ICD-10-CM

## 2020-03-15 DIAGNOSIS — L600 Ingrowing nail: Secondary | ICD-10-CM | POA: Diagnosis not present
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
## 2020-03-21 DIAGNOSIS — M19072 Primary osteoarthritis, left ankle and foot: Secondary | ICD-10-CM | POA: Insufficient documentation

## 2020-03-21 DIAGNOSIS — M170 Bilateral primary osteoarthritis of knee: Secondary | ICD-10-CM | POA: Insufficient documentation

## 2020-04-20 NOTE — Progress Notes (Signed)
Trena Platt Cummings,acting as a scribe for [name], MD.,have documented all relevant documentation on the behalf of [name], MD,as directed by  [name], MD while in the presence of [name], MD.  Established patient visit   Patient: Jose Lewis   DOB: [birth year]   64 y.o. Male  MRN: [id number] Visit Date: 04/21/2020  Today's healthcare provider: [name], MD   Chief Complaint  Patient presents with  . Hyperlipidemia  . Hypertension   Subjective    HPI  Patient is feeling better than it was on his last visit. He has a little mild chronic fatigue.  He has to take a 1 hour nap after work. He has no other symptoms symptoms but is worried about the possibility of Lyme disease. He was also told by orthopedics to consider the possibility of gout. Hypertension, follow-up  BP Readings from Last 3 Encounters:  04/21/20 124/79  01/21/20 126/82  01/11/20 (!) 148/90   Wt Readings from Last 3 Encounters:  04/21/20 (!) 284 lb (128.8 kg)  01/21/20 285 lb 6.4 oz (129.5 kg)  01/11/20 275 lb (124.7 kg)     He was last seen for hypertension 3 months ago.  BP at that visit was 126/83. Management since that visit includes; At this time increase Lotrel to 10/40 daily.  Return to clinic 1 to 2 months He reports excellent compliance with treatment. He is not having side effects.  He is not exercising. He is not adherent to low salt diet.   Outside blood pressures are not being checked.  He does not smoke.  Use of agents associated with hypertension: none.   ---------------------------------------------------------------------------------------------------  Routine health maintenance From 01/21/2020-Stop daily aspirin with no established cardiovascular disease. PHQ-9 on next visit for follow-up.  Covid pandemic and downturn in business really bothered the patient.  We will follow up on next visit.  Obesity From 01/21/2020-Need for  weight  loss through diet and exercise as discussed.  Social History   Tobacco Use  . Smoking status: Never Smoker  . Smokeless tobacco: Never Used  Vaping Use  . Vaping Use: Never used  Substance Use Topics  . Alcohol use: Yes    Comment: occasional drink  . Drug use: No       Medications: Outpatient Medications Prior to Visit  Medication Sig  . amLODipine-benazepril (LOTREL) 10-40 MG capsule Take 1 capsule by mouth daily.  . Glucosamine-Chondroitin (OSTEO BI-FLEX REGULAR STRENGTH) 250-200 MG TABS OSTEO BI-FLEX REGULAR STRENGTH, 250-200MG  (Oral Tablet)  1 Every Day for 0 days  Quantity: 0.00;  Refills: 0   Ordered :27-Dec-2010  [name] ;  Started 29-Nov-2008 Active  . Levocetirizine Dihydrochloride (XYZAL ALLERGY 24HR PO) Take by mouth daily as needed.  . meclizine (ANTIVERT) 25 MG tablet Take by mouth. Reported on 03/01/2016  . meloxicam (MOBIC) 15 MG tablet meloxicam 15 mg tablet  Take 1 tablet every day by oral route with meals.  [name] omeprazole (PRILOSEC) 20 MG capsule Take 20 mg by mouth daily. Patient takes BRAND NAME [place]   . Probiotic Product (PROBIOTIC-10 PO) Take by mouth daily.  . sildenafil (REVATIO) 20 MG tablet TAKE THREE TO FIVE TABLETS BY MOUTH DAILY AS NEEDED.  [name] amLODipine-benazepril (LOTREL) 10-20 MG capsule TAKE 1 CAPSULE BY MOUTH ONCE DAILY (Patient not taking: Reported on 04/21/2020)  . aspirin EC 81 MG tablet Take 81 mg by mouth at bedtime. Reported on 03/01/2016 (Patient not taking: Reported on  04/21/2020)  . vitamin E 1000 UNIT capsule Take 1,000 Units by mouth every other day. (Patient not taking: Reported on 04/21/2020)   No facility-administered medications prior to visit.    Review of Systems  Constitutional: Negative for appetite change, chills and fever.  Respiratory: Negative for chest tightness, shortness of breath and wheezing.   Cardiovascular: Negative for chest pain and palpitations.  Gastrointestinal: Negative for abdominal  pain, nausea and vomiting.       Objective    BP 124/79 (BP Location: Right Arm, Patient Position: Sitting, Cuff Size: Large)   Pulse 69   Temp 98.2 F (36.8 C) (Oral)   Ht 6' (1.829 m)   Wt (!) 284 lb (128.8 kg)   BMI 38.52 kg/m  BP Readings from Last 3 Encounters:  04/21/20 124/79  01/21/20 126/82  01/11/20 (!) 148/90   Wt Readings from Last 3 Encounters:  04/21/20 (!) 284 lb (128.8 kg)  01/21/20 285 lb 6.4 oz (129.5 kg)  01/11/20 275 lb (124.7 kg)      Physical Exam Vitals reviewed.  Constitutional:      Appearance: Normal appearance.  HENT:     Head: Normocephalic and atraumatic.     Right Ear: External ear normal.     Left Ear: External ear normal.  Eyes:     General: No scleral icterus.    Conjunctiva/sclera: Conjunctivae normal.  Cardiovascular:     Rate and Rhythm: Normal rate and regular rhythm.     Pulses: Normal pulses.     Heart sounds: Normal heart sounds.  Pulmonary:     Effort: Pulmonary effort is normal.     Breath sounds: Normal breath sounds.  Musculoskeletal:     Right lower leg: No edema.     Left lower leg: No edema.  Skin:    General: Skin is warm and dry.  Neurological:     General: No focal deficit present.     Mental Status: He is alert and oriented to person, place, and time.  Psychiatric:        Mood and Affect: Mood normal.        Behavior: Behavior normal.        Thought Content: Thought content normal.        Judgment: Judgment normal.       No results found for any visits on 04/21/20.  Assessment & Plan     1. Fatigue, unspecified type Nonspecific and mild and patient is not doing well with his CPAP mask. In the future consider getting Lyme's titer A1c and uric acid level.  I do not think we need this today.  2. Essential hypertension Good control with new dose of Lotrel  3. Hyperlipidemia, unspecified hyperlipidemia type   4. History of prostate cancer   5. OSA (obstructive sleep apnea) On CPAP.  Consider  trying from face mask.  6. Class 2 severe obesity due to excess calories with serious comorbidity and body mass index (BMI) of 38.0 to 38.9 in adult [facility]) With OSA hypertension and hyperlipidemia   No follow-ups on file.         Ghada Abbett Cranford Mon, MD  [facility] [phone] (phone) [phone] (fax)  [place]

## 2020-04-21 ENCOUNTER — Other Ambulatory Visit: Payer: Self-pay

## 2020-04-21 ENCOUNTER — Ambulatory Visit (INDEPENDENT_AMBULATORY_CARE_PROVIDER_SITE_OTHER): Payer: Medicare Other | Admitting: Family Medicine

## 2020-04-21 ENCOUNTER — Encounter: Payer: Self-pay | Admitting: Family Medicine

## 2020-04-21 VITALS — BP 124/79 | HR 69 | Temp 98.2°F | Ht 72.0 in | Wt 284.0 lb

## 2020-04-21 DIAGNOSIS — E785 Hyperlipidemia, unspecified: Secondary | ICD-10-CM | POA: Diagnosis not present

## 2020-04-21 DIAGNOSIS — Z8546 Personal history of malignant neoplasm of prostate: Secondary | ICD-10-CM

## 2020-04-21 DIAGNOSIS — R5383 Other fatigue: Secondary | ICD-10-CM | POA: Diagnosis not present

## 2020-04-21 DIAGNOSIS — I10 Essential (primary) hypertension: Secondary | ICD-10-CM

## 2020-04-21 DIAGNOSIS — Z6838 Body mass index (BMI) 38.0-38.9, adult: Secondary | ICD-10-CM

## 2020-04-21 DIAGNOSIS — G4733 Obstructive sleep apnea (adult) (pediatric): Secondary | ICD-10-CM

## 2020-04-21 DIAGNOSIS — E66812 Obesity, class 2: Secondary | ICD-10-CM

## 2020-04-21 NOTE — Patient Instructions (Signed)
TRY NEW CPAP MASK!!!

## 2020-04-28 ENCOUNTER — Other Ambulatory Visit: Payer: Self-pay | Admitting: Family Medicine

## 2020-04-28 DIAGNOSIS — I10 Essential (primary) hypertension: Secondary | ICD-10-CM

## 2020-04-28 NOTE — Telephone Encounter (Signed)
[facility] faxed refill request for the following:  amLODipine-benazepril (LOTREL) 10-40 MG capsule   Please advise.  Thanks, [employer]

## 2020-04-29 MED ORDER — AMLODIPINE BESY-BENAZEPRIL HCL 10-40 MG PO CAPS: 1.0000 | ORAL_CAPSULE | Freq: Every day | ORAL | 11 refills | Status: DC

## 2020-04-29 NOTE — Telephone Encounter (Signed)
Refill sent to pharmacy for patient.

## 2020-05-20 ENCOUNTER — Other Ambulatory Visit: Payer: Self-pay | Admitting: Urology

## 2020-05-20 DIAGNOSIS — Z8546 Personal history of malignant neoplasm of prostate: Secondary | ICD-10-CM

## 2020-05-24 ENCOUNTER — Other Ambulatory Visit: Payer: Self-pay

## 2020-05-24 ENCOUNTER — Other Ambulatory Visit: Payer: Medicare Other

## 2020-05-24 DIAGNOSIS — Z8546 Personal history of malignant neoplasm of prostate: Secondary | ICD-10-CM

## 2020-05-25 ENCOUNTER — Telehealth: Payer: Self-pay | Admitting: *Deleted

## 2020-05-25 LAB — PSA: Prostate Specific Ag, Serum: <0.1 ng/mL (ref 0.0–4.0)

## 2020-05-25 NOTE — Telephone Encounter (Signed)
-----   Message from Abbie Sons, MD sent at 05/25/2020  7:53 AM EDT ----- PSA remains undetectable at less than 0.1.  Recommend follow-up appointment April 2022 with KUB prior

## 2020-05-25 NOTE — Telephone Encounter (Signed)
Notified patient as instructed, patient pleased. Discussed follow-up appointments, patient agrees  

## 2020-06-21 ENCOUNTER — Telehealth: Payer: Self-pay

## 2020-06-21 NOTE — Telephone Encounter (Signed)
Left message advising patient

## 2020-06-21 NOTE — Telephone Encounter (Signed)
Copied from [place] [id number]. Topic: Quick Communication - See Telephone Encounter >> Jun 21, 2020  9:53 AM [place] wrote: CRM for notification. See Telephone encounter for: 06/21/20.Pt just wants a quick CB from nurse can leave a message , confirming ok with Dr Darnell Level he is getting booster tomorrow and has sch his flu shot in 3 weeks time. VM confirming that is fine sufficient at [phone]

## 2020-06-21 NOTE — Telephone Encounter (Signed)
ok 

## 2020-06-23 ENCOUNTER — Ambulatory Visit: Payer: Medicare Other

## 2020-07-14 ENCOUNTER — Ambulatory Visit: Payer: Medicare Other

## 2020-07-20 ENCOUNTER — Other Ambulatory Visit: Payer: Self-pay

## 2020-07-20 ENCOUNTER — Ambulatory Visit (INDEPENDENT_AMBULATORY_CARE_PROVIDER_SITE_OTHER): Payer: Medicare Other

## 2020-07-20 DIAGNOSIS — Z23 Encounter for immunization: Secondary | ICD-10-CM

## 2020-08-23 ENCOUNTER — Encounter: Payer: Self-pay | Admitting: Family Medicine

## 2020-08-23 ENCOUNTER — Ambulatory Visit (INDEPENDENT_AMBULATORY_CARE_PROVIDER_SITE_OTHER): Payer: Medicare Other | Admitting: Family Medicine

## 2020-08-23 ENCOUNTER — Other Ambulatory Visit: Payer: Self-pay

## 2020-08-23 ENCOUNTER — Ambulatory Visit: Payer: Self-pay | Admitting: Family Medicine

## 2020-08-23 VITALS — BP 119/87 | HR 68 | Temp 98.6°F | Resp 16 | Wt 290.0 lb

## 2020-08-23 DIAGNOSIS — M109 Gout, unspecified: Secondary | ICD-10-CM | POA: Diagnosis not present

## 2020-08-23 DIAGNOSIS — E785 Hyperlipidemia, unspecified: Secondary | ICD-10-CM | POA: Diagnosis not present

## 2020-08-23 DIAGNOSIS — I10 Essential (primary) hypertension: Secondary | ICD-10-CM | POA: Diagnosis not present

## 2020-08-23 DIAGNOSIS — G4733 Obstructive sleep apnea (adult) (pediatric): Secondary | ICD-10-CM

## 2020-08-23 DIAGNOSIS — N138 Other obstructive and reflux uropathy: Secondary | ICD-10-CM

## 2020-08-23 DIAGNOSIS — N401 Enlarged prostate with lower urinary tract symptoms: Secondary | ICD-10-CM

## 2020-08-23 DIAGNOSIS — Z6838 Body mass index (BMI) 38.0-38.9, adult: Secondary | ICD-10-CM

## 2020-08-23 MED ORDER — ALLOPURINOL 100 MG PO TABS: 100.0000 mg | ORAL_TABLET | Freq: Every day | ORAL | 3 refills | Status: DC

## 2020-08-23 NOTE — Progress Notes (Signed)
I,Jose Lewis,acting as a scribe for Jose Durie, MD.,have documented all relevant documentation on the behalf of Jose Durie, MD,as directed by  Jose Durie, MD while in the presence of Jose Durie, MD.  Established patient visit   Patient: Jose Lewis   DOB: 1956-02-22   64 y.o. Male  MRN: [id number] Visit Date: 08/23/2020  Today's healthcare provider: [name], MD   Chief Complaint  Patient presents with  . Follow-up   Subjective    HPI  Overall patient is feeling fairly well.  His blood pressures been well controlled.  He has been seeing Dr. Sabra Heck for chronic arthritic changes of his left knee and ankle.  Dr. Sabra Heck is wondering if he could have some chronic gouty changes of his ankle.  He is not having signs of any acute gout flares.  His uric acid Dr. Ammie Ferrier office was 9.2. He was tried on colchicine but was unable to tolerate it due to severe GI side effects. Hypertension, follow-up  BP Readings from Last 3 Encounters:  08/23/20 119/87  04/21/20 124/79  01/21/20 126/82   Wt Readings from Last 3 Encounters:  08/23/20 290 lb (131.5 kg)  04/21/20 (!) 284 lb (128.8 kg)  01/21/20 285 lb 6.4 oz (129.5 kg)     He was last seen for hypertension 4 months ago.  BP at that visit was 124/79. Management since that visit includes; Good control with new dose of Lotrel. He reports good compliance with treatment. He is not having side effects. none He is not exercising. He is not adherent to low salt diet.   Outside blood pressures are not checking.  He does not smoke.  Use of agents associated with hypertension: none.   --------------------------------------------------------------------  Fatigue, unspecified type From 04/21/2020-Nonspecific and mild and patient is not doing well with his CPAP mask. In the future consider getting Lyme's titer A1c and uric acid level.  I do not think we need this today.  OSA (obstructive  sleep apnea) From 04/21/2020-On CPAP.  Consider trying from face mask.      Medications: Outpatient Medications Prior to Visit  Medication Sig  . amLODipine-benazepril (LOTREL) 10-40 MG capsule Take 1 capsule by mouth daily.  . Glucosamine-Chondroitin (OSTEO BI-FLEX REGULAR STRENGTH) 250-200 MG TABS OSTEO BI-FLEX REGULAR STRENGTH, 250-200MG  (Oral Tablet)  1 Every Day for 0 days  Quantity: 0.00;  Refills: 0   Ordered :27-Dec-2010  [name] ;  Started 29-Nov-2008 Active  . Loratadine (CLARITIN PO) Take by mouth.  . meclizine (ANTIVERT) 25 MG tablet Take by mouth. Reported on 03/01/2016  . meloxicam (MOBIC) 15 MG tablet meloxicam 15 mg tablet  Take 1 tablet every day by oral route with meals.  [name] omeprazole (PRILOSEC) 20 MG capsule Take 20 mg by mouth daily. Patient takes BRAND NAME [place]   . Probiotic Product (PROBIOTIC-10 PO) Take by mouth daily.  . sildenafil (REVATIO) 20 MG tablet TAKE THREE TO FIVE TABLETS BY MOUTH DAILY AS NEEDED.  [name] Levocetirizine Dihydrochloride (XYZAL ALLERGY 24HR PO) Take by mouth daily as needed. (Patient not taking: Reported on 08/23/2020)   No facility-administered medications prior to visit.    Review of Systems  Constitutional: Negative for appetite change, chills and fever.  Respiratory: Negative for chest tightness, shortness of breath and wheezing.   Cardiovascular: Negative for chest pain and palpitations.  Gastrointestinal: Negative for abdominal pain, nausea and vomiting.       Objective    BP  119/87 (BP Location: Right Arm, Patient Position: Sitting, Cuff Size: Large)   Pulse 68   Temp 98.6 F (37 C) (Oral)   Resp 16   Wt 290 lb (131.5 kg)   SpO2 96%   BMI 39.33 kg/m  BP Readings from Last 3 Encounters:  08/23/20 119/87  04/21/20 124/79  01/21/20 126/82   Wt Readings from Last 3 Encounters:  08/23/20 290 lb (131.5 kg)  04/21/20 (!) 284 lb (128.8 kg)  01/21/20 285 lb 6.4 oz (129.5 kg)      Physical Exam Vitals  reviewed.  Constitutional:      Appearance: He is obese.  HENT:     Head: Normocephalic and atraumatic.     Right Ear: External ear normal.     Left Ear: External ear normal.  Eyes:     General: No scleral icterus.    Conjunctiva/sclera: Conjunctivae normal.  Cardiovascular:     Rate and Rhythm: Normal rate and regular rhythm.     Pulses: Normal pulses.     Heart sounds: Normal heart sounds.  Pulmonary:     Effort: Pulmonary effort is normal.     Breath sounds: Normal breath sounds.  Musculoskeletal:     Right lower leg: No edema.     Left lower leg: No edema.     Comments: No local swelling tenderness warmth of  the knee or ankle.  Skin:    General: Skin is warm and dry.  Neurological:     General: No focal deficit present.     Mental Status: He is alert and oriented to person, place, and time.  Psychiatric:        Mood and Affect: Mood normal.        Behavior: Behavior normal.        Thought Content: Thought content normal.        Judgment: Judgment normal.       No results found for any visits on 08/23/20.  Assessment & Plan     1. Gout, unspecified cause, unspecified chronicity, unspecified site Clinically I do not think this is gout.  Patient would like to pursue this we will try allopurinol 100 mg daily and check uric acid on next visit.  He is completely intolerant to colchicine. - allopurinol (ZYLOPRIM) 100 MG tablet; Take 1 tablet (100 mg total) by mouth daily.  Dispense: 30 tablet; Refill: 3 - Comprehensive Metabolic Panel (CMET)  2. Hyperlipidemia, unspecified hyperlipidemia type  - Comprehensive Metabolic Panel (CMET)  3. Essential (primary) hypertension Good control.  4. OSA (obstructive sleep apnea) On CPAP.  5. Benign prostatic hyperplasia with urinary obstruction Followed by urology.  6. Class 2 severe obesity due to excess calories with serious comorbidity and body mass index (BMI) of 38.0 to 38.9 in adult [facility]) With OSA hypertension  hyperlipidemia and reflux  No follow-ups on file.      I, Jose Durie, MD, have reviewed all documentation for this visit. The documentation on 08/27/20 for the exam, diagnosis, procedures, and orders are all accurate and complete.    Maeven Mcdougall Cranford Mon, MD  [facility] [phone] (phone) [phone] (fax)  [place]

## 2020-08-23 NOTE — Patient Instructions (Signed)
Stop colchicine

## 2020-08-24 ENCOUNTER — Telehealth: Payer: Self-pay

## 2020-08-24 LAB — COMPREHENSIVE METABOLIC PANEL
ALT: 42 IU/L (ref 0–44)
AST: 19 IU/L (ref 0–40)
Albumin/Globulin Ratio: 1.7 (ref 1.2–2.2)
Albumin: 4.6 g/dL (ref 3.8–4.8)
Alkaline Phosphatase: 118 IU/L (ref 44–121)
BUN/Creatinine Ratio: 12 (ref 10–24)
BUN: 15 mg/dL (ref 8–27)
Bilirubin Total: 0.4 mg/dL (ref 0.0–1.2)
CO2: 19 mmol/L — ABNORMAL LOW (ref 20–29)
Calcium: 9.1 mg/dL (ref 8.6–10.2)
Chloride: 104 mmol/L (ref 96–106)
Creatinine, Ser: 1.21 mg/dL (ref 0.76–1.27)
GFR calc Af Amer: 72 mL/min/{1.73_m2} (ref >59)
GFR calc non Af Amer: 62 mL/min/{1.73_m2} (ref >59)
Globulin, Total: 2.7 g/dL (ref 1.5–4.5)
Glucose: 107 mg/dL — ABNORMAL HIGH (ref 65–99)
Potassium: 4 mmol/L (ref 3.5–5.2)
Sodium: 140 mmol/L (ref 134–144)
Total Protein: 7.3 g/dL (ref 6.0–8.5)

## 2020-08-24 NOTE — Telephone Encounter (Signed)
-----   Message from Jerrol Banana., MD sent at 08/24/2020  1:05 PM EST ----- Labs in normal range.  This is a normal nonfasting blood sugar and kidney and liver function are good

## 2020-08-24 NOTE — Telephone Encounter (Signed)
Pt advised.   Thanks,   -Tannisha Kennington  

## 2020-09-20 ENCOUNTER — Other Ambulatory Visit: Payer: Self-pay | Admitting: Family Medicine

## 2020-09-20 DIAGNOSIS — M109 Gout, unspecified: Secondary | ICD-10-CM

## 2020-10-28 ENCOUNTER — Telehealth: Payer: Self-pay

## 2020-10-28 DIAGNOSIS — I10 Essential (primary) hypertension: Secondary | ICD-10-CM

## 2020-10-28 MED ORDER — AMLODIPINE BESY-BENAZEPRIL HCL 10-40 MG PO CAPS: 1.0000 | ORAL_CAPSULE | Freq: Every day | ORAL | 3 refills | Status: DC

## 2020-10-28 NOTE — Telephone Encounter (Signed)
Medication sent in. 

## 2020-10-28 NOTE — Telephone Encounter (Signed)
[facility] [place] faxed refill request for the following medications:  Requesting 90 day supply:  amLODipine-benazepril (LOTREL) 10-40 MG capsule    Please advise. Thanks, [employer]

## 2020-11-08 ENCOUNTER — Other Ambulatory Visit: Payer: Self-pay

## 2020-11-08 ENCOUNTER — Ambulatory Visit (INDEPENDENT_AMBULATORY_CARE_PROVIDER_SITE_OTHER): Payer: Medicare Other | Admitting: Family Medicine

## 2020-11-08 ENCOUNTER — Encounter: Payer: Self-pay | Admitting: Family Medicine

## 2020-11-08 ENCOUNTER — Ambulatory Visit: Payer: Self-pay | Admitting: Family Medicine

## 2020-11-08 VITALS — BP 129/83 | HR 66 | Temp 97.6°F | Resp 16 | Ht 72.0 in | Wt 284.0 lb

## 2020-11-08 DIAGNOSIS — G4733 Obstructive sleep apnea (adult) (pediatric): Secondary | ICD-10-CM

## 2020-11-08 DIAGNOSIS — Z6838 Body mass index (BMI) 38.0-38.9, adult: Secondary | ICD-10-CM

## 2020-11-08 DIAGNOSIS — I10 Essential (primary) hypertension: Secondary | ICD-10-CM

## 2020-11-08 DIAGNOSIS — M129 Arthropathy, unspecified: Secondary | ICD-10-CM

## 2020-11-08 DIAGNOSIS — C61 Malignant neoplasm of prostate: Secondary | ICD-10-CM

## 2020-11-08 DIAGNOSIS — E785 Hyperlipidemia, unspecified: Secondary | ICD-10-CM

## 2020-11-08 DIAGNOSIS — M109 Gout, unspecified: Secondary | ICD-10-CM

## 2020-11-08 MED ORDER — TRAMADOL HCL 50 MG PO TABS: 50.0000 mg | ORAL_TABLET | Freq: Two times a day (BID) | ORAL | 3 refills | Status: DC | PRN

## 2020-11-08 NOTE — Progress Notes (Signed)
I,April Jose Lewis,acting as a scribe for Jose Durie, MD.,have documented all relevant documentation on the behalf of Jose Durie, MD,as directed by  Jose Durie, MD while in the presence of Jose Durie, MD.   Established patient visit   Patient: Jose Lewis   DOB: [birth year]   65 y.o. Male  MRN: [id number] Visit Date: 11/08/2020  Today's healthcare provider: [name], MD   Chief Complaint  Patient presents with  . Follow-up  . Hypertension  . Hyperlipidemia   Subjective    HPI  Has several issues today.  Continues to have questions about his chronic pain.  He is trying to find a balance between meloxicam and allopurinol and what is safe to take.  Purinol may help his pain a little bit. He is appropriately concerned about his kidney function with the meloxicam. Complains of a rash in the groin that is asymptomatic.  He has treated it with topical jock itch medicine and it is better. Hypertension, follow-up  BP Readings from Last 3 Encounters:  11/08/20 129/83  08/23/20 119/87  04/21/20 124/79   Wt Readings from Last 3 Encounters:  11/08/20 284 lb (128.8 kg)  08/23/20 290 lb (131.5 kg)  04/21/20 (!) 284 lb (128.8 kg)     He was last seen for hypertension 3 months ago.  BP at that visit was 119/87. Management since that visit includes no medication changes.  He reports good compliance with treatment. He is not having side effects. none He is following a Regular diet. He is not exercising. He does not smoke.  Use of agents associated with hypertension: none.   Outside blood pressures are not checking. Pertinent labs: Lab Results  Component Value Date   CHOL 229 (H) 01/25/2020   HDL 33 (L) 01/25/2020   LDLCALC 132 (H) 01/25/2020   TRIG 353 (H) 01/25/2020   CHOLHDL 6.9 (H) 01/25/2020   Lab Results  Component Value Date   NA 140 08/23/2020   K 4.0 08/23/2020   CREATININE 1.21 08/23/2020   GFRNONAA 62 08/23/2020    GFRAA 72 08/23/2020   GLUCOSE 107 (H) 08/23/2020     The 10-year ASCVD risk score [name] DC Jr., et al., 2013) is: 20.6%   --------------------------------------------------------------------------------------------------- Lipid/Cholesterol, Follow-up  Last lipid panel Other pertinent labs  Lab Results  Component Value Date   CHOL 229 (H) 01/25/2020   HDL 33 (L) 01/25/2020   LDLCALC 132 (H) 01/25/2020   TRIG 353 (H) 01/25/2020   CHOLHDL 6.9 (H) 01/25/2020   Lab Results  Component Value Date   ALT 42 08/23/2020   AST 19 08/23/2020   PLT 219 01/25/2020   TSH 1.920 01/25/2020     He was last seen for this 3 months ago.  Management since that visit includes no medication changes.  He reports good compliance with treatment. He is not having side effects. none Current diet: well balanced Current exercise: none  The 10-year ASCVD risk score [name] DC Jr., et al., 2013) is: 20.6%  --------------------------------------------------------------------  Gout  The patient was last seen for this 3 months ago. Changes made at last visit include try allopurinol 100 mg daily and check uric acid on next visit.  [name]  He reports good compliance with treatment. He feels that condition is Improved. He is not having side effects. none         Medications: Outpatient Medications Prior to Visit  Medication Sig  . allopurinol (ZYLOPRIM) 100 MG tablet  Take 1 tablet (100 mg total) by mouth daily.  [name] amLODipine-benazepril (LOTREL) 10-40 MG capsule Take 1 capsule by mouth daily.  . Glucosamine-Chondroitin 250-200 MG TABS OSTEO BI-FLEX REGULAR STRENGTH, 250-200MG  (Oral Tablet)  1 Every Day for 0 days  Quantity: 0.00;  Refills: 0   Ordered :27-Dec-2010  [name] ;  Started 29-Nov-2008 Active  . Loratadine (CLARITIN PO) Take by mouth.  . meclizine (ANTIVERT) 25 MG tablet Take by mouth. Reported on 03/01/2016  . meloxicam (MOBIC) 15 MG tablet meloxicam 15 mg tablet  Take 1 tablet  every day by oral route with meals.  [name] omeprazole (PRILOSEC) 20 MG capsule Take 20 mg by mouth daily. Patient takes BRAND NAME [place]  . Probiotic Product (PROBIOTIC-10 PO) Take by mouth daily.  . sildenafil (REVATIO) 20 MG tablet TAKE THREE TO FIVE TABLETS BY MOUTH DAILY AS NEEDED.  [name] Levocetirizine Dihydrochloride (XYZAL ALLERGY 24HR PO) Take by mouth daily as needed. (Patient not taking: No sig reported)   No facility-administered medications prior to visit.    Review of Systems      Objective    BP 129/83 (BP Location: Right Arm, Patient Position: Sitting, Cuff Size: Large)   Pulse 66   Temp 97.6 F (36.4 C) (Oral)   Resp 16   Ht 6' (1.829 m)   Wt 284 lb (128.8 kg)   SpO2 95%   BMI 38.52 kg/m  BP Readings from Last 3 Encounters:  11/08/20 129/83  08/23/20 119/87  04/21/20 124/79   Wt Readings from Last 3 Encounters:  11/08/20 284 lb (128.8 kg)  08/23/20 290 lb (131.5 kg)  04/21/20 (!) 284 lb (128.8 kg)       Physical Exam Vitals reviewed.  Constitutional:      Appearance: He is obese.  HENT:     Head: Normocephalic and atraumatic.     Right Ear: External ear normal.     Left Ear: External ear normal.  Eyes:     General: No scleral icterus.    Conjunctiva/sclera: Conjunctivae normal.  Cardiovascular:     Rate and Rhythm: Normal rate and regular rhythm.     Pulses: Normal pulses.     Heart sounds: Normal heart sounds.  Pulmonary:     Effort: Pulmonary effort is normal.     Breath sounds: Normal breath sounds.  Musculoskeletal:     Right lower leg: No edema.     Left lower leg: No edema.     Comments: No local swelling tenderness warmth of  the knee or ankle.  Skin:    General: Skin is warm and dry.     Comments: Skin intact  but there is minimal changes consistent with old tinea cruris in the groin.  Neurological:     General: No focal deficit present.     Mental Status: He is alert and oriented to person, place, and time.  Psychiatric:         Mood and Affect: Mood normal.        Behavior: Behavior normal.        Thought Content: Thought content normal.        Judgment: Judgment normal.       No results found for any visits on 11/08/20.  Assessment & Plan     1. Essential (primary) hypertension Good control. - Renal function panel  2. OSA (obstructive sleep apnea) On CPAP  3. Class 2 severe obesity due to excess calories with serious comorbidity and body mass index (  BMI) of 38.0 to 38.9 in adult [facility]) An exercise stress test as I think that would also help his chronic pain  4. Hyperlipidemia, unspecified hyperlipidemia type Diet and exercise.  Last LDL 132  5. Prostate cancer [facility]) Prostate cancer followed by urology  6. Arthropathia Use meloxicam and occasional tramadol and then try to use topicals.  Not use anything for more than a week.  7. Gouty arthritis Check uric acid with goal less than 6 - Uric acid - Renal function panel   No follow-ups on file.      I, Jose Durie, MD, have reviewed all documentation for this visit. The documentation on 11/11/20 for the exam, diagnosis, procedures, and orders are all accurate and complete.    Akiera Allbaugh Cranford Mon, MD  [facility] [phone] (phone) [phone] (fax)  [place]

## 2020-11-10 LAB — RENAL FUNCTION PANEL
Albumin: 4.4 g/dL (ref 3.8–4.8)
BUN/Creatinine Ratio: 13 (ref 10–24)
BUN: 16 mg/dL (ref 8–27)
CO2: 23 mmol/L (ref 20–29)
Calcium: 9.8 mg/dL (ref 8.6–10.2)
Chloride: 105 mmol/L (ref 96–106)
Creatinine, Ser: 1.2 mg/dL (ref 0.76–1.27)
GFR calc Af Amer: 73 mL/min/{1.73_m2} (ref >59)
GFR calc non Af Amer: 63 mL/min/{1.73_m2} (ref >59)
Glucose: 100 mg/dL — ABNORMAL HIGH (ref 65–99)
Phosphorus: 3.1 mg/dL (ref 2.8–4.1)
Potassium: 4.4 mmol/L (ref 3.5–5.2)
Sodium: 142 mmol/L (ref 134–144)

## 2020-11-10 LAB — URIC ACID: Uric Acid: 7.6 mg/dL (ref 3.8–8.4)

## 2020-12-16 ENCOUNTER — Other Ambulatory Visit: Payer: Self-pay

## 2020-12-16 ENCOUNTER — Encounter: Payer: Self-pay | Admitting: Urology

## 2020-12-16 ENCOUNTER — Ambulatory Visit
Admission: RE | Admit: 2020-12-16 | Discharge: 2020-12-16 | Disposition: A | Discharge status: Home / Self Care | Payer: Medicare Other | Source: Ambulatory Visit | Attending: Urology | Admitting: Urology

## 2020-12-16 ENCOUNTER — Ambulatory Visit
Admission: RE | Admit: 2020-12-16 | Discharge: 2020-12-16 | Disposition: A | Discharge status: Home / Self Care | Payer: Medicare Other | Attending: Urology | Admitting: Urology

## 2020-12-16 ENCOUNTER — Ambulatory Visit (INDEPENDENT_AMBULATORY_CARE_PROVIDER_SITE_OTHER): Payer: Medicare Other | Admitting: Urology

## 2020-12-16 VITALS — BP 122/81 | HR 84 | Ht 72.0 in | Wt 278.0 lb

## 2020-12-16 DIAGNOSIS — N200 Calculus of kidney: Secondary | ICD-10-CM

## 2020-12-16 DIAGNOSIS — N5089 Other specified disorders of the male genital organs: Secondary | ICD-10-CM

## 2020-12-16 DIAGNOSIS — N5082 Scrotal pain: Secondary | ICD-10-CM | POA: Diagnosis not present

## 2020-12-16 DIAGNOSIS — Z8546 Personal history of malignant neoplasm of prostate: Secondary | ICD-10-CM | POA: Diagnosis not present
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
## 2020-12-16 NOTE — Patient Instructions (Signed)
Prostate Cancer Screening  Prostate cancer screening is a test that is done to check for the presence of prostate cancer in men. The prostate gland is a walnut-sized gland that is located below the bladder and in front of the rectum in males. The function of the prostate is to add fluid to semen during ejaculation. Prostate cancer is the second most common type of cancer in men. Who should have prostate cancer screening?  Screening recommendations vary based on age and other risk factors. Screening is recommended if:  You are older than age 55. If you are age 55-69, talk with your health care provider about your need for screening and how often screening should be done. Because most prostate cancers are slow growing and will not cause death, screening is generally reserved in this age group for men who have a 10-15-year life expectancy.  You are younger than age 55, and you have these risk factors: ? Being a black male or a male of African descent. ? Having a father, brother, or uncle who has been diagnosed with prostate cancer. The risk is higher if your family member's cancer occurred at an early age. Screening is not recommended if:  You are younger than age 40.  You are between the ages of 40 and 54 and you have no risk factors.  You are 66 years of age or older. At this age, the risks that screening can cause are greater than the benefits that it may provide. If you are at high risk for prostate cancer, your health care provider may recommend that you have screenings more often or that you start screening at a younger age. How is screening for prostate cancer done? The recommended prostate cancer screening test is a blood test called the prostate-specific antigen (PSA) test. PSA is a protein that is made in the prostate. As you age, your prostate naturally produces more PSA. Abnormally high PSA levels may be caused by:  Prostate cancer.  An enlarged prostate that is not caused by cancer  (benign prostatic hyperplasia, BPH). This condition is very common in older men.  A prostate gland infection (prostatitis). Depending on the PSA results, you may need more tests, such as:  A physical exam to check the size of your prostate gland.  Blood and imaging tests.  A procedure to remove tissue samples from your prostate gland for testing (biopsy). What are the benefits of prostate cancer screening?  Screening can help to identify cancer at an early stage, before symptoms start and when the cancer can be treated more easily.  There is a small chance that screening may lower your risk of dying from prostate cancer. The chance is small because prostate cancer is a slow-growing cancer, and most men with prostate cancer die from a different cause. What are the risks of prostate cancer screening? The main risk of prostate cancer screening is diagnosing and treating prostate cancer that would never have caused any symptoms or problems. This is called overdiagnosisand overtreatment. PSA screening cannot tell you if your PSA is high due to cancer or a different cause. A prostate biopsy is the only procedure to diagnose prostate cancer. Even the results of a biopsy may not tell you if your cancer needs to be treated. Slow-growing prostate cancer may not need any treatment other than monitoring, so diagnosing and treating it may cause unnecessary stress or other side effects. A prostate biopsy may also cause:  Infection or fever.  A false negative. This is   a result that shows that you do not have prostate cancer when you actually do have prostate cancer. Questions to ask your health care provider  When should I start prostate cancer screening?  What is my risk for prostate cancer?  How often do I need screening?  What type of screening tests do I need?  How do I get my test results?  What do my results mean?  Do I need treatment? Where to find more information  The American Cancer  Society: www.cancer.org  American Urological Association: www.auanet.org Contact a health care provider if:  You have difficulty urinating.  You have pain when you urinate or ejaculate.  You have blood in your urine or semen.  You have pain in your back or in the area of your prostate. Summary  Prostate cancer is a common type of cancer in men. The prostate gland is located below the bladder and in front of the rectum. This gland adds fluid to semen during ejaculation.  Prostate cancer screening may identify cancer at an early stage, when the cancer can be treated more easily.  The prostate-specific antigen (PSA) test is the recommended screening test for prostate cancer.  Discuss the risks and benefits of prostate cancer screening with your health care provider. If you are age 66 or older, the risks that screening can cause are greater than the benefits that it may provide. This information is not intended to replace advice given to you by your health care provider. Make sure you discuss any questions you have with your health care provider. Document Revised: 01/01/2020 Document Reviewed: 04/23/2019 Elsevier Patient Education  [place].

## 2020-12-17 LAB — PSA: Prostate Specific Ag, Serum: <0.1 ng/mL (ref 0.0–4.0)

## 2020-12-19 ENCOUNTER — Telehealth: Payer: Self-pay | Admitting: *Deleted

## 2020-12-19 ENCOUNTER — Other Ambulatory Visit: Payer: Self-pay | Admitting: Family Medicine

## 2020-12-19 DIAGNOSIS — M109 Gout, unspecified: Secondary | ICD-10-CM

## 2020-12-19 NOTE — Telephone Encounter (Signed)
Notified patient as instructed, patient pleased. Discussed follow-up appointments, patient agrees  

## 2020-12-19 NOTE — Telephone Encounter (Signed)
-----   Message from Abbie Sons, MD sent at 12/18/2020  1:26 PM EDT ----- PSA undetectable at <0.1.  Follow-up as scheduled

## 2020-12-23 ENCOUNTER — Ambulatory Visit: Payer: Self-pay | Admitting: Urology

## 2021-01-09 ENCOUNTER — Ambulatory Visit: Payer: Medicare Other | Admitting: Family Medicine

## 2021-01-20 ENCOUNTER — Ambulatory Visit: Payer: Medicare Other | Admitting: Family Medicine

## 2021-02-02 ENCOUNTER — Ambulatory Visit (INDEPENDENT_AMBULATORY_CARE_PROVIDER_SITE_OTHER): Payer: Medicare Other | Admitting: Family Medicine

## 2021-02-02 ENCOUNTER — Encounter: Payer: Self-pay | Admitting: Family Medicine

## 2021-02-02 ENCOUNTER — Other Ambulatory Visit: Payer: Self-pay

## 2021-02-02 VITALS — BP 109/80 | HR 76 | Temp 98.2°F | Resp 16 | Ht 72.0 in | Wt 282.0 lb

## 2021-02-02 DIAGNOSIS — I10 Essential (primary) hypertension: Secondary | ICD-10-CM | POA: Diagnosis not present

## 2021-02-02 DIAGNOSIS — E785 Hyperlipidemia, unspecified: Secondary | ICD-10-CM

## 2021-02-02 DIAGNOSIS — G4733 Obstructive sleep apnea (adult) (pediatric): Secondary | ICD-10-CM | POA: Diagnosis not present

## 2021-02-02 DIAGNOSIS — Z6838 Body mass index (BMI) 38.0-38.9, adult: Secondary | ICD-10-CM

## 2021-02-02 DIAGNOSIS — Z9889 Other specified postprocedural states: Secondary | ICD-10-CM

## 2021-02-02 DIAGNOSIS — C61 Malignant neoplasm of prostate: Secondary | ICD-10-CM

## 2021-02-02 DIAGNOSIS — M109 Gout, unspecified: Secondary | ICD-10-CM | POA: Diagnosis not present

## 2021-02-02 MED ORDER — ALLOPURINOL 300 MG PO TABS: 300.0000 mg | ORAL_TABLET | Freq: Every day | ORAL | 3 refills | Status: DC
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
## 2021-02-04 LAB — CBC WITH DIFFERENTIAL/PLATELET
Basophils Absolute: 0 10*3/uL (ref 0.0–0.2)
Basos: 1 %
EOS (ABSOLUTE): 0.2 10*3/uL (ref 0.0–0.4)
Eos: 3 %
Hematocrit: 41.6 % (ref 37.5–51.0)
Hemoglobin: 14.4 g/dL (ref 13.0–17.7)
Immature Grans (Abs): 0 10*3/uL (ref 0.0–0.1)
Immature Granulocytes: 1 %
Lymphocytes Absolute: 1.4 10*3/uL (ref 0.7–3.1)
Lymphs: 25 %
MCH: 31.1 pg (ref 26.6–33.0)
MCHC: 34.6 g/dL (ref 31.5–35.7)
MCV: 90 fL (ref 79–97)
Monocytes Absolute: 0.3 10*3/uL (ref 0.1–0.9)
Monocytes: 6 %
Neutrophils Absolute: 3.7 10*3/uL (ref 1.4–7.0)
Neutrophils: 64 %
Platelets: 208 10*3/uL (ref 150–450)
RBC: 4.63 x10E6/uL (ref 4.14–5.80)
RDW: 13.5 % (ref 11.6–15.4)
WBC: 5.7 10*3/uL (ref 3.4–10.8)

## 2021-02-04 LAB — COMPREHENSIVE METABOLIC PANEL
ALT: 28 IU/L (ref 0–44)
AST: 16 IU/L (ref 0–40)
Albumin/Globulin Ratio: 1.8 (ref 1.2–2.2)
Albumin: 4.3 g/dL (ref 3.8–4.8)
Alkaline Phosphatase: 103 IU/L (ref 44–121)
BUN/Creatinine Ratio: 9 — ABNORMAL LOW (ref 10–24)
BUN: 11 mg/dL (ref 8–27)
Bilirubin Total: 0.5 mg/dL (ref 0.0–1.2)
CO2: 24 mmol/L (ref 20–29)
Calcium: 9.5 mg/dL (ref 8.6–10.2)
Chloride: 105 mmol/L (ref 96–106)
Creatinine, Ser: 1.29 mg/dL — ABNORMAL HIGH (ref 0.76–1.27)
Globulin, Total: 2.4 g/dL (ref 1.5–4.5)
Glucose: 110 mg/dL — ABNORMAL HIGH (ref 65–99)
Potassium: 4.7 mmol/L (ref 3.5–5.2)
Sodium: 142 mmol/L (ref 134–144)
Total Protein: 6.7 g/dL (ref 6.0–8.5)
eGFR: 61 mL/min/{1.73_m2} (ref >59)

## 2021-02-04 LAB — LIPID PANEL
Chol/HDL Ratio: 6.5 ratio — ABNORMAL HIGH (ref 0.0–5.0)
Cholesterol, Total: 216 mg/dL — ABNORMAL HIGH (ref 100–199)
HDL: 33 mg/dL — ABNORMAL LOW (ref >39)
LDL Chol Calc (NIH): 142 mg/dL — ABNORMAL HIGH (ref 0–99)
Triglycerides: 227 mg/dL — ABNORMAL HIGH (ref 0–149)
VLDL Cholesterol Cal: 41 mg/dL — ABNORMAL HIGH (ref 5–40)

## 2021-02-04 LAB — TSH: TSH: 1.16 u[IU]/mL (ref 0.450–4.500)

## 2021-02-15 ENCOUNTER — Telehealth: Payer: Self-pay

## 2021-02-15 MED ORDER — ALLOPURINOL 100 MG PO TABS: 100.0000 mg | ORAL_TABLET | Freq: Two times a day (BID) | ORAL | 3 refills | Status: DC

## 2021-02-15 NOTE — Telephone Encounter (Signed)
Spoke with patient on the phone an advised him as below, he states that he would just like to take 100mg  BID I advised Dr. Rosanna Randy who has approved for new prescription to be sent in. KW

## 2021-02-15 NOTE — Telephone Encounter (Signed)
-----   Message from Jerrol Banana., MD sent at 02/09/2021  7:24 AM EDT ----- If he just wants it to take to the 100s and that is fine.  With his level I will recommend going to the 300 daily.

## 2021-03-30 ENCOUNTER — Ambulatory Visit: Payer: Medicare Other | Admitting: Family Medicine

## 2021-06-13 ENCOUNTER — Other Ambulatory Visit: Payer: Self-pay

## 2021-06-13 ENCOUNTER — Other Ambulatory Visit: Payer: Medicare Other

## 2021-06-13 DIAGNOSIS — Z8546 Personal history of malignant neoplasm of prostate: Secondary | ICD-10-CM

## 2021-06-14 ENCOUNTER — Other Ambulatory Visit: Payer: Medicare Other

## 2021-06-14 LAB — PSA: Prostate Specific Ag, Serum: <0.1 ng/mL (ref 0.0–4.0)

## 2021-06-21 ENCOUNTER — Ambulatory Visit (INDEPENDENT_AMBULATORY_CARE_PROVIDER_SITE_OTHER): Payer: Medicare Other

## 2021-06-21 ENCOUNTER — Other Ambulatory Visit: Payer: Self-pay

## 2021-06-21 DIAGNOSIS — Z23 Encounter for immunization: Secondary | ICD-10-CM

## 2021-06-22 ENCOUNTER — Ambulatory Visit
Admission: RE | Admit: 2021-06-22 | Discharge: 2021-06-22 | Disposition: A | Discharge status: Home / Self Care | Payer: Medicare Other | Source: Ambulatory Visit | Attending: Urology | Admitting: Urology

## 2021-06-22 ENCOUNTER — Other Ambulatory Visit: Payer: Self-pay

## 2021-06-22 ENCOUNTER — Ambulatory Visit (INDEPENDENT_AMBULATORY_CARE_PROVIDER_SITE_OTHER): Payer: Medicare Other | Admitting: Urology

## 2021-06-22 VITALS — BP 93/70 | HR 87 | Ht 71.0 in | Wt 278.0 lb

## 2021-06-22 DIAGNOSIS — Z8546 Personal history of malignant neoplasm of prostate: Secondary | ICD-10-CM

## 2021-06-22 DIAGNOSIS — N200 Calculus of kidney: Secondary | ICD-10-CM | POA: Insufficient documentation
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
## 2021-06-25 ENCOUNTER — Encounter: Payer: Self-pay | Admitting: Urology

## 2021-06-28 ENCOUNTER — Other Ambulatory Visit: Payer: Self-pay | Admitting: Family Medicine

## 2021-06-28 NOTE — Telephone Encounter (Signed)
Ordered changed to twice a day. Once a day discontinued on 02/15/21

## 2021-06-29 ENCOUNTER — Other Ambulatory Visit: Payer: Self-pay | Admitting: Family Medicine

## 2021-08-07 ENCOUNTER — Telehealth: Payer: Self-pay | Admitting: Family Medicine

## 2021-08-07 MED ORDER — ALLOPURINOL 100 MG PO TABS
100.0000 mg | ORAL_TABLET | Freq: Two times a day (BID) | ORAL | 1 refills | Status: DC
Start: 2021-08-07 — End: 2021-10-16

## 2021-08-07 NOTE — Telephone Encounter (Signed)
[facility] faxed refill request for the following medications:  allopurinol (ZYLOPRIM) 100 MG tablet   Please advise.

## 2021-08-07 NOTE — Telephone Encounter (Signed)
Rx was sent to pharmacy. 

## 2021-08-09 ENCOUNTER — Other Ambulatory Visit: Payer: Self-pay

## 2021-08-09 ENCOUNTER — Ambulatory Visit (INDEPENDENT_AMBULATORY_CARE_PROVIDER_SITE_OTHER): Payer: Medicare Other | Admitting: Family Medicine

## 2021-08-09 ENCOUNTER — Encounter: Payer: Self-pay | Admitting: Family Medicine

## 2021-08-09 VITALS — BP 118/78 | HR 71 | Resp 16 | Wt 289.0 lb

## 2021-08-09 DIAGNOSIS — Z8546 Personal history of malignant neoplasm of prostate: Secondary | ICD-10-CM

## 2021-08-09 DIAGNOSIS — I10 Essential (primary) hypertension: Secondary | ICD-10-CM | POA: Diagnosis not present

## 2021-08-09 DIAGNOSIS — K5730 Diverticulosis of large intestine without perforation or abscess without bleeding: Secondary | ICD-10-CM

## 2021-08-09 DIAGNOSIS — R7303 Prediabetes: Secondary | ICD-10-CM

## 2021-08-09 DIAGNOSIS — Z6838 Body mass index (BMI) 38.0-38.9, adult: Secondary | ICD-10-CM

## 2021-08-09 DIAGNOSIS — M109 Gout, unspecified: Secondary | ICD-10-CM | POA: Diagnosis not present

## 2021-08-09 DIAGNOSIS — G4733 Obstructive sleep apnea (adult) (pediatric): Secondary | ICD-10-CM

## 2021-08-09 DIAGNOSIS — E785 Hyperlipidemia, unspecified: Secondary | ICD-10-CM

## 2021-08-09 DIAGNOSIS — K219 Gastro-esophageal reflux disease without esophagitis: Secondary | ICD-10-CM

## 2021-08-09 DIAGNOSIS — R202 Paresthesia of skin: Secondary | ICD-10-CM
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
## 2021-08-10 LAB — RENAL FUNCTION PANEL
Albumin: 4.5 g/dL (ref 3.8–4.8)
BUN/Creatinine Ratio: 14 (ref 10–24)
BUN: 16 mg/dL (ref 8–27)
CO2: 23 mmol/L (ref 20–29)
Calcium: 9.6 mg/dL (ref 8.6–10.2)
Chloride: 106 mmol/L (ref 96–106)
Creatinine, Ser: 1.17 mg/dL (ref 0.76–1.27)
Glucose: 88 mg/dL (ref 70–99)
Phosphorus: 3.7 mg/dL (ref 2.8–4.1)
Potassium: 4.5 mmol/L (ref 3.5–5.2)
Sodium: 144 mmol/L (ref 134–144)
eGFR: 69 mL/min/{1.73_m2} (ref >59)

## 2021-08-10 LAB — URIC ACID: Uric Acid: 5.8 mg/dL (ref 3.8–8.4)

## 2021-08-10 LAB — HEMOGLOBIN A1C
Est. average glucose Bld gHb Est-mCnc: 123 mg/dL
Hgb A1c MFr Bld: 5.9 % — ABNORMAL HIGH (ref 4.8–5.6)

## 2021-08-10 LAB — B12 AND FOLATE PANEL
Folate: 5.4 ng/mL (ref >3.0)
Vitamin B-12: 235 pg/mL (ref 232–1245)

## 2021-08-21 ENCOUNTER — Other Ambulatory Visit: Payer: Self-pay | Admitting: Family Medicine

## 2021-08-21 NOTE — Telephone Encounter (Signed)
Medication: allopurinol (ZYLOPRIM) 100 MG tablet [[id number]]   Has the patient contacted their pharmacy? Medication was sent to the wrong pharmacy can it be sent to the pharmacy below please (Agent: If no, request that the patient contact the pharmacy for the refill. If patient does not wish to contact the pharmacy document the reason why and proceed with request.) (Agent: If yes, when and what did the pharmacy advise?)  Preferred Pharmacy (with phone number or street name): [facility] [place], [place] [address] Phone: [phone] Fax: [phone] Hours: Not open 24 hours   Has the patient been seen for an appointment in the last year OR does the patient have an upcoming appointment? YES   Agent: Please be advised that RX refills may take up to 3 business days. We ask that you follow-up with your pharmacy.

## 2021-08-22 NOTE — Telephone Encounter (Signed)
Rx refilled 08/07/2021 #120 with 1 refill. Pt should enough medication to last until 11/2021.

## 2021-08-24 NOTE — Telephone Encounter (Signed)
Attempted to call [facility] [phone] to clarify them getting the allopurinol 100 mg.   They are closed and don't open until 8:00 AM Mon-Fri.   Will attempt later.   Pt had called in saying this was sent to the wrong pharmacy, wants it sent to [facility].

## 2021-08-24 NOTE — Telephone Encounter (Signed)
I called [facility] to follow up on the allopurinol 100 mg if they had received this rx or not.   Pt called in saying this rx was sent to the wrong pharmacy and wanted it sent to [facility].  It looks like [facility] is who it was sent to.     They did received the allopurinol rx and pt picked it up 08/16/2021.   He has 2 refills remaining.    I will cancel the request that we received for this to be sent to [facility] since they have it and it's been picked up.

## 2021-08-25 ENCOUNTER — Other Ambulatory Visit: Payer: Self-pay | Admitting: Family Medicine

## 2021-08-26 NOTE — Telephone Encounter (Signed)
Requested medication (s) are due for refill today: NO  Requested medication (s) are on the active medication list: NO  Last refill:  03/28/21  Future visit scheduled: 02/14/22  Notes to clinic:  med not on current med list, please assess.   Requested Prescriptions  Pending Prescriptions Disp Refills   traMADol (ULTRAM) 50 MG tablet [Pharmacy Med Name: TRAMADOL HCL 50 MG TAB] 55 tablet     Sig: TAKE 1 TABLET BY MOUTH EVERY 12 HOURS ASNEEDED FOR UP TO 5 DAYS     Not Delegated - Analgesics:  Opioid Agonists Failed - 08/25/2021  9:19 AM      Failed - This refill cannot be delegated      Failed - Urine Drug Screen completed in last 360 days      Passed - Valid encounter within last 6 months    Recent Outpatient Visits           2 weeks ago Essential hypertension   [facility] Jerrol Banana., MD   6 months ago Essential hypertension   [facility] Jerrol Banana., MD   9 months ago Essential (primary) hypertension   [facility] Jerrol Banana., MD   1 year ago Gout, unspecified cause, unspecified chronicity, unspecified site   [facility] Jerrol Banana., MD   1 year ago Fatigue, unspecified type   [facility] Jerrol Banana., MD       Future Appointments             In 10 months Silas, Ronda Fairly, MD [facility]

## 2021-08-28 NOTE — Telephone Encounter (Signed)
LOV: 08/09/2021   Thanks,   -Mickel Baas

## 2021-09-22 ENCOUNTER — Ambulatory Visit: Payer: Self-pay

## 2021-09-22 NOTE — Telephone Encounter (Signed)
Chief Complaint: Flu/COVID symptoms, tested negative COVID x 3 separate tests on 3 different days Symptoms: Runny nose, sneezing, fever (100.5-99.2), nasal congestion, cough Frequency: symptoms started on 12/24, but mostly on 12/27 Pertinent Negatives: Patient denies SOB Disposition: [] ED /[x] Urgent Care (no appt availability in office) / [] Appointment(In office/virtual)/ []  [facility] [facility]/ [] Home Care/ [] Refused Recommended Disposition /[] [place] [facility]/ []  Follow-up with PCP Additional Notes: Patient reports being around people who tested positive for Flu. I advised Virtual UC, patient doesn't have a smart phone or computer for that visit, advised to go to the UC. He says he has to go and hung up the phone. Unsure what patient will end up doing.    Summary: possible flu/ neg covid   Pt called saying he has symptom of the flu.  He has taken 3 covid test all neg.  He has cough , fever, headache congestion .  Chest and head.   CB#  [phone]      Reason for Disposition  [1] HIGH RISK patient AND [2] influenza exposure within the last 7 days AND [3] ONE OR MORE respiratory symptoms: cough, sore throat, runny or stuffy nose  Answer Assessment - Initial Assessment Questions 1. COVID-19 DIAGNOSIS: "Who made your COVID-19 diagnosis?" "Was it confirmed by a positive lab test or self-test?" If not diagnosed by a doctor (or NP/PA), ask "Are there lots of cases (community spread) where you live?" Note: See public health department website, if unsure.     Tested COVID negative x 3 times 2. COVID-19 EXPOSURE: "Was there any known exposure to COVID before the symptoms began?" CDC Definition of close contact: within 6 feet (2 meters) for a total of 15 minutes or more over a 24-hour period.      Unknown 3. ONSET: "When did the COVID-19 symptoms start?"      Cough last Saturday 09/16/21 from post nasal drip;  4. WORST SYMPTOM: "What is your worst symptom?" (e.g., cough, fever, shortness  of breath, muscle aches)     Sneezing, runny nose 5. COUGH: "Do you have a cough?" If Yes, ask: "How bad is the cough?"       Yes, not really bad 6. FEVER: "Do you have a fever?" If Yes, ask: "What is your temperature, how was it measured, and when did it start?"     99.1 last checked, highest last night 100.5 7. RESPIRATORY STATUS: "Describe your breathing?" (e.g., shortness of breath, wheezing, unable to speak)      No 8. BETTER-SAME-WORSE: "Are you getting better, staying the same or getting worse compared to yesterday?"  If getting worse, ask, "In what way?"     Better 9. HIGH RISK DISEASE: "Do you have any chronic medical problems?" (e.g., asthma, heart or lung disease, weak immune system, obesity, etc.)     N/A 10. VACCINE: "Have you had the COVID-19 vaccine?" If Yes, ask: "Which one, how many shots, when did you get it?"       N/A 11. BOOSTER: "Have you received your COVID-19 booster?" If Yes, ask: "Which one and when did you get it?"       N/A 12. PREGNANCY: "Is there any chance you are pregnant?" "When was your last menstrual period?"       N/A 13. OTHER SYMPTOMS: "Do you have any other symptoms?"  (e.g., chills, fatigue, headache, loss of smell or taste, muscle pain, sore throat)       Sneezing, runny nose, nasal congestion, fever  14. O2 SATURATION  MONITOR:  "Do you use an oxygen saturation monitor (pulse oximeter) at home?" If Yes, ask "What is your reading (oxygen level) today?" "What is your usual oxygen saturation reading?" (e.g., 95%)       N/A  Protocols used: Coronavirus (COVID-19) Diagnosed or Suspected-A-AH

## 2021-10-16 ENCOUNTER — Other Ambulatory Visit: Payer: Self-pay | Admitting: Family Medicine

## 2021-10-31 ENCOUNTER — Other Ambulatory Visit: Payer: Self-pay | Admitting: Family Medicine

## 2021-10-31 DIAGNOSIS — I10 Essential (primary) hypertension: Secondary | ICD-10-CM

## 2021-11-10 ENCOUNTER — Other Ambulatory Visit: Payer: Self-pay | Admitting: Physician Assistant

## 2021-11-10 ENCOUNTER — Ambulatory Visit: Payer: Self-pay | Admitting: *Deleted

## 2021-11-10 DIAGNOSIS — U071 COVID-19: Secondary | ICD-10-CM

## 2021-11-10 MED ORDER — MOLNUPIRAVIR EUA 200MG CAPSULE: 4.0000 | ORAL_CAPSULE | Freq: Two times a day (BID) | ORAL | 0 refills | Status: AC

## 2021-11-10 NOTE — Progress Notes (Signed)
Advised pt not to take w/ sildenafil

## 2021-11-10 NOTE — Telephone Encounter (Signed)
Summary: covid positive/wants med   Pt tested covid positive today.  Has sinus issues, low grade fever, congestion, cough  Symptoms started Wed.  Would like the antivirals .///[facility]        Chief Complaint: requesting antiviral medication.tested positive for covid today sx started 11/08/21 Symptoms: cough. Post nasal drip , sinus congestion, fever 99.2 Frequency: since 11/08/21 Pertinent Negatives: Patient denies difficulty breathing. Disposition: [] ED /[x] Urgent Care (no appt availability in office) / [] Appointment(In office/virtual)/ []  [facility] [facility]/ [] Home Care/ [x] Refused Recommended Disposition /[] [facility]/ []  Follow-up with PCP Additional Notes:   Requesting a call back today. Patient would like to hear back from PCP prior to going to UC or another area for medication. No available appt until 11/13/21. Please advise      Reason for Disposition  [1] HIGH RISK for severe COVID complications (e.g., weak immune system, age > 83 years, obesity with BMI > 25, pregnant, chronic lung disease or other chronic medical condition) AND [2] COVID symptoms (e.g., cough, fever)  (Exceptions: Already seen by PCP and no new or worsening symptoms.)  Answer Assessment - Initial Assessment Questions 1. COVID-19 DIAGNOSIS: "Who made your COVID-19 diagnosis?" "Was it confirmed by a positive lab test or self-test?" If not diagnosed by a doctor (or NP/PA), ask "Are there lots of cases (community spread) where you live?" Note: See public health department website, if unsure.     Tested positive at home test and [facility]  2. COVID-19 EXPOSURE: "Was there any known exposure to COVID before the symptoms began?" CDC Definition of close contact: within 6 feet (2 meters) for a total of 15 minutes or more over a 24-hour period.      na 3. ONSET: "When did the COVID-19 symptoms start?"      Wednesday 11/08/21 4. WORST SYMPTOM: "What is your worst symptom?" (e.g., cough, fever,  shortness of breath, muscle aches)     Cough, post nasal drip, sinus congestion fever 5. COUGH: "Do you have a cough?" If Yes, ask: "How bad is the cough?"       Yes little to no productive cough  6. FEVER: "Do you have a fever?" If Yes, ask: "What is your temperature, how was it measured, and when did it start?"     99.2 7. RESPIRATORY STATUS: "Describe your breathing?" (e.g., shortness of breath, wheezing, unable to speak)      na 8. BETTER-SAME-WORSE: "Are you getting better, staying the same or getting worse compared to yesterday?"  If getting worse, ask, "In what way?"     na 9. HIGH RISK DISEASE: "Do you have any chronic medical problems?" (e.g., asthma, heart or lung disease, weak immune system, obesity, etc.)     na 10. VACCINE: "Have you had the COVID-19 vaccine?" If Yes, ask: "Which one, how many shots, when did you get it?"       na 11. BOOSTER: "Have you received your COVID-19 booster?" If Yes, ask: "Which one and when did you get it?"       na 12. PREGNANCY: "Is there any chance you are pregnant?" "When was your last menstrual period?"       na 13. OTHER SYMPTOMS: "Do you have any other symptoms?"  (e.g., chills, fatigue, headache, loss of smell or taste, muscle pain, sore throat)       No  14. O2 SATURATION MONITOR:  "Do you use an oxygen saturation monitor (pulse oximeter) at home?" If Yes, ask "What is  your reading (oxygen level) today?" "What is your usual oxygen saturation reading?" (e.g., 95%)       na  Protocols used: Coronavirus (COVID-19) Diagnosed or Suspected-A-AH

## 2021-12-05 ENCOUNTER — Other Ambulatory Visit: Payer: Self-pay

## 2021-12-05 ENCOUNTER — Ambulatory Visit (INDEPENDENT_AMBULATORY_CARE_PROVIDER_SITE_OTHER): Payer: Medicare Other | Admitting: Physician Assistant

## 2021-12-05 ENCOUNTER — Encounter: Payer: Self-pay | Admitting: Physician Assistant

## 2021-12-05 VITALS — BP 120/77 | HR 73 | Temp 97.7°F | Resp 16 | Wt 288.5 lb

## 2021-12-05 DIAGNOSIS — J0100 Acute maxillary sinusitis, unspecified: Secondary | ICD-10-CM | POA: Diagnosis not present

## 2021-12-05 MED ORDER — AMOXICILLIN 875 MG PO TABS: 875.0000 mg | ORAL_TABLET | Freq: Two times a day (BID) | ORAL | 0 refills | Status: AC

## 2021-12-05 NOTE — Patient Instructions (Signed)
Based on your exam and symptoms I recommend that we treat you for a bacterial sinus infection ?I am sending in a script for Amoxicillin 875 mg to be taken by mouth twice per day for 7 days ?Please finish the entire course to ensure resolution.  ? ?You can continue using your over the counter medications for added symptomatic relief ?And you can use a nasal saline spray to assist with irritation.  ? ?It was nice to meet you and I appreciate the opportunity to be involved in your care ? ?

## 2021-12-05 NOTE — Progress Notes (Signed)
?  ? ?I,Joseline E Rosas,acting as a scribe for [employer], PA-C.,have documented all relevant documentation on the behalf of [place], PA-C,as directed by  [employer], PA-C while in the presence of Carlen Fils E Annaleah Arata, PA-C.  ? ?Established patient visit ? ? ?Patient: Jose Lewis   DOB: [birth year]   66 y.o. Male  MRN: [id number] ?Visit Date: 12/05/2021 ? ?Today's healthcare provider: Dani Gobble Rilynn Habel, PA-C  ?Introduced myself to the patient as a Journalist, newspaper and provided education on APPs in clinical practice.  ? ? ?Chief Complaint  ?Patient presents with  ? URI  ? ?Subjective  ?  ?URI  ?Associated symptoms include congestion, coughing, sinus pain and a sore throat. Pertinent negatives include no chest pain, diarrhea, ear pain, headaches, nausea, rhinorrhea or vomiting.  ?HPI   ? ? URI   ?Associated symptoms inlclude congestion, cough, shortness of breath and sinus pain.  Recent episode started 1 to 4 weeks ago.  The problem has been waxing and waning since onset.  The temperature has been with in normal range.  Patient  is drinking plenty of fluids. ? ?  ?  ?Last edited by Doristine Devoid, CMA on 12/05/2021  1:21 PM.  ?  ?  ?Patient reports that he tested Positive for COVID February 17. Took the antiviral medication reports that he got better for a few days and then symptoms started back up. Reports he has been coughing so much that his stomach muscles are hurting.  ?Has taken allergy medicine, Robitussin. ? ?States he feels this is more post nasal drip cough, seemed like it was getting better but now feels like it has worsened ? ?Reports productive cough of yellowish- green phlegm ? ?States he sleeps with a CPAP machine  ?Yesterday morning  he said he woke up with a sore throat and kept coughing up multiple rounds of mucus  ? ? ?Medications: ?Outpatient Medications Prior to Visit  ?Medication Sig  ? allopurinol (ZYLOPRIM) 100 MG tablet TAKE 1 TABLET BY MOUTH TWICE DAILY  ? amLODipine-benazepril (LOTREL) 10-40 MG  capsule TAKE 1 CAPSULE BY MOUTH ONCE DAILY  ? Glucosamine-Chondroitin 250-200 MG TABS OSTEO BI-FLEX REGULAR STRENGTH, 250-'200MG'$  (Oral Tablet) ? 1 Every Day for 0 days ? Quantity: 0.00;  Refills: 0 ? ? Ordered :27-Dec-2010 ? [name] ;  Started 29-Nov-2008 ?Active  ? Levocetirizine Dihydrochloride (XYZAL ALLERGY 24HR PO) Take by mouth daily as needed.  ? Loratadine (CLARITIN PO) Take by mouth.  ? meclizine (ANTIVERT) 25 MG tablet Take by mouth. Reported on 03/01/2016  ? meloxicam (MOBIC) 15 MG tablet meloxicam 15 mg tablet ? Take 1 tablet every day by oral route with meals.  ? omeprazole (PRILOSEC) 20 MG capsule Take 20 mg by mouth daily. Patient takes BRAND NAME [place]  ? Probiotic Product (PROBIOTIC-10 PO) Take by mouth daily.  ? sildenafil (REVATIO) 20 MG tablet TAKE THREE TO FIVE TABLETS BY MOUTH DAILY AS NEEDED.  ? traMADol (ULTRAM) 50 MG tablet TAKE 1 TABLET BY MOUTH EVERY 12 HOURS ASNEEDED FOR UP TO 5 DAYS  ? ?No facility-administered medications prior to visit.  ? ? ?Review of Systems  ?Constitutional:  Negative for fatigue and fever.  ?HENT:  Positive for congestion, postnasal drip, sinus pressure, sinus pain and sore throat. Negative for ear pain and rhinorrhea.   ?Respiratory:  Positive for cough.   ?Cardiovascular:  Negative for chest pain.  ?Gastrointestinal:  Negative for diarrhea, nausea and vomiting.  ?Musculoskeletal:  Negative for  arthralgias and myalgias.  ?Neurological:  Negative for dizziness, light-headedness and headaches.  ? ? ?  Objective  ?  ?BP 120/77 (BP Location: Left Arm, Patient Position: Sitting, Cuff Size: Large)   Pulse 73   Temp 97.7 ?F (36.5 ?C) (Oral)   Resp 16   Wt 288 lb 8 oz (130.9 kg)   SpO2 96%   BMI 40.24 kg/m?  ? ? ?Physical Exam ?Vitals reviewed.  ?Constitutional:   ?   General: He is awake.  ?   Appearance: Normal appearance. He is well-developed and well-groomed. He is obese.  ?HENT:  ?   Head: Normocephalic and atraumatic.  ?   Right Ear: Hearing, ear  canal and external ear normal. A middle ear effusion is present.  ?   Left Ear: Hearing, tympanic membrane, ear canal and external ear normal.  No middle ear effusion.  ?Cardiovascular:  ?   Rate and Rhythm: Normal rate and regular rhythm.  ?   Pulses: Normal pulses.     ?     Radial pulses are 2+ on the right side and 2+ on the left side.  ?   Heart sounds: Normal heart sounds.  ?Pulmonary:  ?   Effort: Pulmonary effort is normal.  ?   Breath sounds: Normal breath sounds. No decreased breath sounds, wheezing, rhonchi or rales.  ?Neurological:  ?   Mental Status: He is alert.  ?Psychiatric:     ?   Behavior: Behavior is cooperative.  ?  ? ?No results found for any visits on 12/05/21. ? Assessment & Plan  ?  ? ? ? ?Problem List Items Addressed This Visit   ?None ?Visit Diagnoses   ? ? Acute non-recurrent maxillary sinusitis    -  Primary ?Acute, new problem ?Symptoms include congestion, cough, and sinus pain that have not resolved following infection with COVID in Feb ?Reports he started to feel better but then cough and congestion began in earnest and do not seem to be relenting after trying Robitussin and OTC allergy medication ?Given chronicity and location of symptoms I suspect bacterial sinusitis at this time and recommend he begin Amoxicillin 875 mg PO BID x 7 days  ?Can continue OTC medications for symptomatic relief  ?Follow up as needed for persistent symptoms ?  ? Relevant Medications  ? amoxicillin (AMOXIL) 875 MG tablet  ? ?  ? ? ? ?No follow-ups on file. ? ? ?I, Binyamin Nelis E Sheylin Scharnhorst, PA-C, have reviewed all documentation for this visit. The documentation on 12/05/21 for the exam, diagnosis, procedures, and orders are all accurate and complete. ? ? ?Kurt Hoffmeier, Glennie Isle MPH ?[place] ?[facility] ? ? ?No follow-ups on file.  ?   ? ? ?Rica Heather E Mija Effertz, PA-C  ?[place] ?[phone] (phone) ?[phone] (fax) ? ?[facility]  ?

## 2022-01-01 ENCOUNTER — Ambulatory Visit: Payer: Self-pay | Admitting: *Deleted

## 2022-01-01 NOTE — Telephone Encounter (Signed)
?  Chief Complaint: Middle finger on left hand possibly infected ?Symptoms: red, swollen, throbbing side of the cuticle  ?Frequency: Started last [place]. Or Fri.  Cut the nail too close possibly ?Pertinent Negatives: Patient denies pus or liquid draining from it. ?Disposition: '[]'$ ED /'[]'$ Urgent Care (no appt availability in office) / '[x]'$ Appointment(In office/virtual)/ '[]'$  [place] [facility]/ '[]'$ Home Care/ '[]'$ Refused Recommended Disposition /'[]'$ [facility]/ '[]'$  Follow-up with PCP ?Additional Notes:   ?

## 2022-01-01 NOTE — Telephone Encounter (Signed)
I returned pt's call.   C/o having a cut on the middle finger of his left hand that is painful and possibly infected from possibly cutting the nail too close.   Using Neosporin but wanted further advice. ? ? ?Reason for Disposition ? Redness and painful skin around fingernail (cuticle, nailfold) ? ?Answer Assessment - Initial Assessment Questions ?1. ONSET: "When did the pain start?"  ?    Started end of last week Thur or Fri.   I trimmed my nails.   I think I cut too close to the corner.   It's on the outer side of the nail.   I'm soaking it in hot water twice a day.   Now it's sore.   I have a bandaid with Neosporin over it.   The cuticle is intact.   It's throbbing today.    ?2. LOCATION and RADIATION: "Where is the pain located?"  (e.g., fingertip, around nail, joint, entire  ?finger)  ?    Middle finger of left hand.   It's on the outer side of the nail.   ?3. SEVERITY: "How bad is the pain?" "What does it keep you from doing?"   (Scale 1-10; or mild, moderate, severe) ? - MILD (1-3): doesn't interfere with normal activities.  ? - MODERATE (4-7): interferes with normal activities or awakens from sleep. ? - SEVERE (8-10): excruciating pain, unable to hold a glass of water or bend finger even a little. ?    Moderate ?4. APPEARANCE: "What does the finger look like?" (e.g., redness, swelling, bruising, pallor) ?    I saw a white spot show up  while soaking it.   I opened it with a needle and there was no pus or liquid in it.   I just barely opened the skin.  It seems like it's infected.   Its red and swollen. ?5. WORK OR EXERCISE: "Has there been any recent work or exercise that involved this part (i.e., fingers or hand) of the body?" ?    Cutting my nail too close, I think. ?6. CAUSE: "What do you think is causing the pain?" ?    It's possibly infected.   It throbs to the first joint of the finger.   ?7. AGGRAVATING FACTORS: "What makes the pain worse?" (e.g., using computer) ?    Touching it or hitting it against  anything.   ?8. OTHER SYMPTOMS: "Do you have any other symptoms?" (e.g., fever, neck pain, numbness) ?    No ?9. PREGNANCY: "Is there any chance you are pregnant?" "When was your last menstrual period?" ?    N/A ? ?Protocols used: Finger Pain-A-AH ? ?

## 2022-01-03 ENCOUNTER — Encounter: Payer: Self-pay | Admitting: Physician Assistant

## 2022-01-03 ENCOUNTER — Ambulatory Visit (INDEPENDENT_AMBULATORY_CARE_PROVIDER_SITE_OTHER): Payer: Medicare Other | Admitting: Physician Assistant

## 2022-01-03 VITALS — BP 135/89 | HR 80 | Temp 98.0°F | Resp 16 | Wt 292.2 lb

## 2022-01-03 DIAGNOSIS — L03019 Cellulitis of unspecified finger: Secondary | ICD-10-CM

## 2022-01-03 DIAGNOSIS — S61349A Puncture wound with foreign body of unspecified finger with damage to nail, initial encounter: Secondary | ICD-10-CM | POA: Diagnosis not present

## 2022-01-03 DIAGNOSIS — L089 Local infection of the skin and subcutaneous tissue, unspecified: Secondary | ICD-10-CM

## 2022-01-03 DIAGNOSIS — Z23 Encounter for immunization: Secondary | ICD-10-CM | POA: Diagnosis not present

## 2022-01-03 DIAGNOSIS — S61313A Laceration without foreign body of left middle finger with damage to nail, initial encounter: Secondary | ICD-10-CM | POA: Diagnosis not present

## 2022-01-03 DIAGNOSIS — T148XXA Other injury of unspecified body region, initial encounter: Secondary | ICD-10-CM

## 2022-01-03 MED ORDER — AMOXICILLIN-POT CLAVULANATE 875-125 MG PO TABS: 1.0000 | ORAL_TABLET | Freq: Two times a day (BID) | ORAL | 0 refills | Status: DC

## 2022-01-03 NOTE — Progress Notes (Signed)
?  ? ? ?I,Nickalous Stingley Robinson,acting as a Education administrator for [employer], PA-C.,have documented all relevant documentation on the behalf of Mardene Speak, PA-C,as directed by  [employer], PA-C while in the presence of [employer], PA-C. ? ?Established patient visit ? ? ?Patient: Jose Lewis   DOB: 1955/09/25   66 y.o. Male  MRN: [id number] ?Visit Date: 01/03/2022 ? ?Today's healthcare provider: Mardene Speak, PA-C  ? ?CC: swelling , pain and redness of the middle finger ? ? ?Subjective  ?  ?HPI  ?Patient presents with left middle finger/redness, swelling and severe pain. ?Patient reports that most likely small wound was caused by sharp object. Onset x 5 days ago.  Patient started soaking in plain hot water and applying neosporin since.  ?Patient is right-handed. Reports having difficulties keeping his wound clean due to its location. ? ?Denies having any DM, other immunosuppressive disorders ?  ? ?Medications: ?Outpatient Medications Prior to Visit  ?Medication Sig  ? allopurinol (ZYLOPRIM) 100 MG tablet TAKE 1 TABLET BY MOUTH TWICE DAILY  ? amLODipine-benazepril (LOTREL) 10-40 MG capsule TAKE 1 CAPSULE BY MOUTH ONCE DAILY  ? cyanocobalamin 1000 MCG tablet Take 1,000 mcg by mouth daily.  ? Glucosamine-Chondroitin 250-200 MG TABS OSTEO BI-FLEX REGULAR STRENGTH, 250-'200MG'$  (Oral Tablet) ? 1 Every Day for 0 days ? Quantity: 0.00;  Refills: 0 ? ? Ordered :27-Dec-2010 ? [name] ;  Started 29-Nov-2008 ?Active  ? Levocetirizine Dihydrochloride (XYZAL ALLERGY 24HR PO) Take by mouth daily as needed.  ? Loratadine (CLARITIN PO) Take by mouth.  ? meclizine (ANTIVERT) 25 MG tablet Take by mouth. Reported on 03/01/2016  ? meloxicam (MOBIC) 15 MG tablet meloxicam 15 mg tablet ? Take 1 tablet every day by oral route with meals.  ? omeprazole (PRILOSEC) 20 MG capsule Take 20 mg by mouth daily. Patient takes BRAND NAME [place]  ? Probiotic Product (PROBIOTIC-10 PO) Take by mouth daily.  ? sildenafil (REVATIO) 20 MG tablet  TAKE THREE TO FIVE TABLETS BY MOUTH DAILY AS NEEDED.  ? traMADol (ULTRAM) 50 MG tablet TAKE 1 TABLET BY MOUTH EVERY 12 HOURS ASNEEDED FOR UP TO 5 DAYS  ? ?No facility-administered medications prior to visit.  ? ? ?Review of Systems  ?All other systems reviewed and are negative. ?Except HPI ? ?  Objective  ?  ?BP 135/89 (BP Location: Right Arm, Patient Position: Sitting, Cuff Size: Large)   Pulse 80   Temp 98 ?F (36.7 ?C) (Oral)   Resp 16   Wt 292 lb 3.2 oz (132.5 kg)   SpO2 97%   BMI 40.75 kg/m?  ? ? ?Physical Exam ?Vitals and nursing note reviewed.  ?Constitutional:   ?   General: He is in acute distress.  ?   Appearance: Normal appearance.  ?HENT:  ?   Head: Normocephalic and atraumatic.  ?Musculoskeletal:     ?   General: Normal range of motion.  ?   Left lower leg: Edema present.  ?Skin: ?   Capillary Refill: Capillary refill takes less than 2 seconds.  ?   Findings: Erythema and lesion present.  ?   Comments: Erythema, warm, tender on palpation, edema of the fingertip pulp of the left digitus medius manus ?ROM intact  ?Neurological:  ?   Mental Status: He is alert and oriented to person, place, and time.  ?Psychiatric:     ?   Behavior: Behavior normal.     ?   Thought Content: Thought content normal.     ?  Judgment: Judgment normal.  ?  ? ? ?No results found for any visits on 01/03/22. ? Assessment & Plan  ?  ? ?1. Cellulitis of fingertip pulp, left ?From possible small laceration ?- Keep wound clean, avoid any activities that might contaminate the infected area. ?- Continue to soak his finger in salt water. ?- amoxicillin-clavulanate (AUGMENTIN) 875-125 MG tablet; Take 1 tablet by mouth 2 (two) times daily.  For 7 days. ?- Td vaccine greater than or equal to 7yo IM. Last Tdap was on 01/07/13 ? ?FU as needed ?   ?The patient was advised to call back or seek an in-person evaluation if the symptoms worsen or if the condition fails to improve as anticipated. ? ?I discussed the assessment and treatment plan  with the patient. The patient was provided an opportunity to ask questions and all were answered. The patient agreed with the plan and demonstrated an understanding of the instructions. ? ?The entirety of the information documented in the History of Present Illness, Review of Systems and Physical Exam were personally obtained by me. Portions of this information were initially documented by the CMA and reviewed by me for thoroughness and accuracy.   ? ?Mardene Speak, PA-C  ?[place] ?[phone] (phone) ?[phone] (fax) ? ?[facility] ?

## 2022-01-17 ENCOUNTER — Other Ambulatory Visit: Payer: Self-pay | Admitting: Family Medicine

## 2022-01-18 NOTE — Telephone Encounter (Signed)
Requested Prescriptions  ?Pending Prescriptions Disp Refills  ?? allopurinol (ZYLOPRIM) 100 MG tablet [Pharmacy Med Name: ALLOPURINOL 100 MG TAB] 90 tablet 0  ?  Sig: TAKE 1 TABLET BY MOUTH TWICE DAILY  ?  ? Endocrinology:  Gout Agents - allopurinol Passed - 01/17/2022 12:44 PM  ?  ?  Passed - Uric Acid in normal range and within 360 days  ?  Uric Acid  ?Date Value Ref Range Status  ?08/09/2021 5.8 3.8 - 8.4 mg/dL Final  ?  Comment:  ?             Therapeutic target for gout patients: <6.0  ?   ?  ?  Passed - Cr in normal range and within 360 days  ?  Creatinine  ?Date Value Ref Range Status  ?01/25/2013 1.74 (H) 0.60 - 1.30 mg/dL Final  ? ?Creatinine, Ser  ?Date Value Ref Range Status  ?08/09/2021 1.17 0.76 - 1.27 mg/dL Final  ?   ?  ?  Passed - Valid encounter within last 12 months  ?  Recent Outpatient Visits   ?      ? 2 weeks ago Cellulitis of finger, unspecified laterality  ? [facility] [place], [place], [place]  ? 1 month ago Acute non-recurrent maxillary sinusitis  ? [employer], Erin E, PA-C  ? 5 months ago Essential hypertension  ? [facility] Jerrol Banana., MD  ? 11 months ago Essential hypertension  ? [facility] Jerrol Banana., MD  ? 1 year ago Essential (primary) hypertension  ? [facility] Jerrol Banana., MD  ?  ?  ?Future Appointments   ?        ? In 5 months Stoioff, Ronda Fairly, MD [place]  ?  ? ?  ?  ?  Passed - CBC within normal limits and completed in the last 12 months  ?  WBC  ?Date Value Ref Range Status  ?02/03/2021 5.7 3.4 - 10.8 x10E3/uL Final  ?01/26/2013 12.5 (H) 4.0 - 10.5 K/uL Final  ? ?RBC  ?Date Value Ref Range Status  ?02/03/2021 4.63 4.14 - 5.80 x10E6/uL Final  ?01/26/2013 4.43 4.22 - 5.81 MIL/uL Final  ? ?Hemoglobin  ?Date Value Ref Range Status  ?02/03/2021 14.4 13.0 - 17.7 g/dL Final  ? ?Hematocrit  ?Date Value Ref Range Status  ?02/03/2021 41.6 37.5 - 51.0 %  Final  ? ?MCHC  ?Date Value Ref Range Status  ?02/03/2021 34.6 31.5 - 35.7 g/dL Final  ?01/26/2013 33.7 30.0 - 36.0 g/dL Final  ? ?MCH  ?Date Value Ref Range Status  ?02/03/2021 31.1 26.6 - 33.0 pg Final  ?01/26/2013 30.5 26.0 - 34.0 pg Final  ? ?MCV  ?Date Value Ref Range Status  ?02/03/2021 90 79 - 97 fL Final  ?01/25/2013 90 80 - 100 fL Final  ? ?No results found for: PLTCOUNTKUC, LABPLAT, [place] ?RDW  ?Date Value Ref Range Status  ?02/03/2021 13.5 11.6 - 15.4 % Final  ?01/25/2013 13.7 11.5 - 14.5 % Final  ? ?  ?  ?  ? ? ?

## 2022-02-13 ENCOUNTER — Other Ambulatory Visit: Payer: Self-pay | Admitting: Family Medicine

## 2022-02-14 ENCOUNTER — Ambulatory Visit (INDEPENDENT_AMBULATORY_CARE_PROVIDER_SITE_OTHER): Payer: Medicare Other | Admitting: Family Medicine

## 2022-02-14 ENCOUNTER — Encounter: Payer: Self-pay | Admitting: Family Medicine

## 2022-02-14 VITALS — BP 124/76 | HR 78 | Resp 16 | Ht 72.0 in | Wt 290.0 lb

## 2022-02-14 DIAGNOSIS — Z0000 Encounter for general adult medical examination without abnormal findings: Secondary | ICD-10-CM

## 2022-02-14 DIAGNOSIS — E538 Deficiency of other specified B group vitamins: Secondary | ICD-10-CM

## 2022-02-14 DIAGNOSIS — Z23 Encounter for immunization: Secondary | ICD-10-CM | POA: Diagnosis not present

## 2022-02-14 DIAGNOSIS — K219 Gastro-esophageal reflux disease without esophagitis: Secondary | ICD-10-CM

## 2022-02-14 DIAGNOSIS — R7303 Prediabetes: Secondary | ICD-10-CM | POA: Diagnosis not present

## 2022-02-14 DIAGNOSIS — Z6838 Body mass index (BMI) 38.0-38.9, adult: Secondary | ICD-10-CM

## 2022-02-14 DIAGNOSIS — E785 Hyperlipidemia, unspecified: Secondary | ICD-10-CM | POA: Diagnosis not present

## 2022-02-14 DIAGNOSIS — I10 Essential (primary) hypertension: Secondary | ICD-10-CM

## 2022-02-14 DIAGNOSIS — G4733 Obstructive sleep apnea (adult) (pediatric): Secondary | ICD-10-CM

## 2022-02-14 NOTE — Progress Notes (Signed)
Annual Wellness Visit    I,[name],acting as a scribe for [name], MD.,have documented all relevant documentation on the behalf of [name], MD,as directed by  [name], MD while in the presence of [name], MD.   Patient: Jrake Rodriquez, Male    DOB: 13-Aug-1956, 66 y.o.   MRN: [id number] Visit Date: 02/14/2022  Today's Provider: [name], MD   Chief Complaint  Patient presents with   Medicare Wellness   Subjective    Sylas Twombly is a 66 y.o. male who presents today for his Annual Wellness Visit. He reports consuming a general diet. Home exercise routine includes yard work. He generally feels fairly well. He reports sleeping fairly well. He does not have additional problems to discuss today.   HPI On oral B12 for B12 deficiency.  He has had COVID-vaccine x5 and had COVID in February 2023 Continues have chronic joint pain with chronic bilateral knee pain and significant left ankle pain stiffness Wears a CPAP nightly with benefit   Medications: Outpatient Medications Prior to Visit  Medication Sig   allopurinol (ZYLOPRIM) 100 MG tablet TAKE 1 TABLET BY MOUTH TWICE DAILY   amLODipine-benazepril (LOTREL) 10-40 MG capsule TAKE 1 CAPSULE BY MOUTH ONCE DAILY   cyanocobalamin 1000 MCG tablet Take 1,000 mcg by mouth daily.   Glucosamine-Chondroitin 250-200 MG TABS OSTEO BI-FLEX REGULAR STRENGTH, 250-'200MG'$  (Oral Tablet)  1 Every Day for 0 days  Quantity: 0.00;  Refills: 0   Ordered :27-Dec-2010  [name] ;  Started 29-Nov-2008 Active   Levocetirizine Dihydrochloride (XYZAL ALLERGY 24HR PO) Take by mouth daily as needed.   Loratadine (CLARITIN PO) Take by mouth.   meclizine (ANTIVERT) 25 MG tablet Take by mouth. Reported on 03/01/2016   meloxicam (MOBIC) 15 MG tablet meloxicam 15 mg tablet  Take 1 tablet every day by oral route with meals.   omeprazole (PRILOSEC) 20 MG capsule Take 20 mg by mouth daily.  Patient takes BRAND NAME PRILOSEC ONLY   Probiotic Product (PROBIOTIC-10 PO) Take by mouth daily.   sildenafil (REVATIO) 20 MG tablet TAKE THREE TO FIVE TABLETS BY MOUTH DAILY AS NEEDED.   [DISCONTINUED] amoxicillin-clavulanate (AUGMENTIN) 875-125 MG tablet Take 1 tablet by mouth 2 (two) times daily. (Patient not taking: Reported on 02/14/2022)   No facility-administered medications prior to visit.    No Known Allergies  Patient Care Team: Jerrol Banana., MD as PCP - General (Unknown Physician Specialty) Christene Lye, MD (General Surgery) Arlis Porta., MD (Inactive) (Family Medicine)  Review of Systems  Eyes:  Positive for itching.  Respiratory:  Positive for apnea.   Musculoskeletal:  Positive for arthralgias.  Allergic/Immunologic: Positive for environmental allergies.  Neurological:  Positive for dizziness.  All other systems reviewed and are negative.      Objective    Vitals: BP 124/76 (BP Location: Right Arm, Patient Position: Sitting, Cuff Size: Large)   Pulse 78   Resp 16   Ht 6' (1.829 m)   Wt 290 lb (131.5 kg)   SpO2 93%   BMI 39.33 kg/m     Physical Exam Vitals reviewed.  Constitutional:      Appearance: He is well-developed. He is obese.     Comments: Obese WM NAD.  HENT:     Head: Normocephalic and atraumatic.     Right Ear: External ear normal.     Left Ear: External ear normal.  Nose: Nose normal.  Eyes:     General: No scleral icterus.    Conjunctiva/sclera: Conjunctivae normal.  Neck:     Thyroid: No thyromegaly.  Cardiovascular:     Rate and Rhythm: Normal rate and regular rhythm.     Heart sounds: Normal heart sounds.  Pulmonary:     Effort: Pulmonary effort is normal.  Abdominal:     Palpations: Abdomen is soft.     Comments: There is a small umbilical hernia.  Genitourinary:    Penis: Normal.      Testes: Normal.  Musculoskeletal:     Comments: Trace LE edema.  Skin:    General: Skin is warm and dry.   Neurological:     General: No focal deficit present.     Mental Status: He is alert and oriented to person, place, and time.  Psychiatric:        Mood and Affect: Mood normal.        Behavior: Behavior normal.        Thought Content: Thought content normal.        Judgment: Judgment normal.     Most recent functional status assessment:    01/03/2022    8:19 AM  In your present state of health, do you have any difficulty performing the following activities:  Hearing? 0  Vision? 0  Difficulty concentrating or making decisions? 0  Walking or climbing stairs? 0  Dressing or bathing? 0  Doing errands, shopping? 0   Most recent fall risk assessment:    01/03/2022    8:19 AM  Fall Risk   Falls in the past year? 0  Number falls in past yr: 0  Injury with Fall? 0  Follow up Falls evaluation completed    Most recent depression screenings:    01/03/2022    8:19 AM 12/05/2021    1:18 PM  PHQ 2/9 Scores  PHQ - 2 Score 1 1  PHQ- 9 Score 3    Most recent cognitive screening:     View : No data to display.         Most recent Audit-C alcohol use screening    01/03/2022    8:19 AM  Alcohol Use Disorder Test (AUDIT)  1. How often do you have a drink containing alcohol? 0  2. How many drinks containing alcohol do you have on a typical day when you are drinking? 0  3. How often do you have six or more drinks on one occasion? 0  AUDIT-C Score 0   A score of 3 or more in women, and 4 or more in men indicates increased risk for alcohol abuse, EXCEPT if all of the points are from question 1   No results found for any visits on 02/14/22.  Assessment & Plan     Annual wellness visit done today including the all of the following: Reviewed patient's Family Medical History Reviewed and updated list of patient's medical providers Assessment of cognitive impairment was done Assessed patient's functional ability Established a written schedule for health screening [place] Completed and Reviewed  Exercise Activities and Dietary recommendations  Goals   None     Immunization History  Administered Date(s) Administered   Fluad Quad(high Dose 65+) 07/20/2020, 06/21/2021   Influenza Split 08/26/2004   Influenza,inj,Quad PF,6+ Mos 07/11/2013, 07/03/2014, 09/02/2015, 07/20/2016, 07/27/2017, 07/26/2018   PNEUMOCOCCAL CONJUGATE-20 02/14/2022   Pneumococcal Polysaccharide-23 01/21/2020   Td 09/11/2003, 01/03/2022   Tdap 01/07/2013   Zoster,  Live 02/09/2015    Health Maintenance  Topic Date Due   COVID-19 Vaccine (1) Never done   Hepatitis C Screening  Never done   Zoster Vaccines- Shingrix (1 of 2) Never done   INFLUENZA VACCINE  04/24/2022   COLONOSCOPY (Pts 45-85yr Insurance coverage will need to be confirmed)  04/08/2028   TETANUS/TDAP  01/04/2032   Pneumonia Vaccine 67 Years old  Completed   HPV VACCINES  Aged Out     Discussed health benefits of physical activity, and encouraged him to engage in regular exercise appropriate for his age and condition.    1. Encounter for Medicare annual wellness exam  - Lipid panel - TSH - CBC w/Diff/Platelet - Comprehensive Metabolic Panel (CMET) - Vitamin B12 - Hemoglobin A1c  2. Essential hypertension  - Lipid panel - TSH - CBC w/Diff/Platelet - Comprehensive Metabolic Panel (CMET) - Vitamin B12 - Hemoglobin A1c  3. Hyperlipidemia, unspecified hyperlipidemia type  - Lipid panel - TSH - CBC w/Diff/Platelet - Comprehensive Metabolic Panel (CMET) - Vitamin B12 - Hemoglobin A1c  4. Prediabetes  - Lipid panel - TSH - CBC w/Diff/Platelet - Comprehensive Metabolic Panel (CMET) - Vitamin B12 - Hemoglobin A1c  5. Class 2 severe obesity due to excess calories with serious comorbidity and body mass index (BMI) of 38.0 to 38.9 in adult (HCC)  - Lipid panel - TSH - CBC w/Diff/Platelet - Comprehensive Metabolic Panel (CMET) - Vitamin B12 - Hemoglobin A1c  6.  Gastroesophageal reflux disease without esophagitis  - Lipid panel - TSH - CBC w/Diff/Platelet - Comprehensive Metabolic Panel (CMET) - Vitamin B12 - Hemoglobin A1c  7. OSA (obstructive sleep apnea)  - Lipid panel - TSH - CBC w/Diff/Platelet - Comprehensive Metabolic Panel (CMET) - Vitamin B12 - Hemoglobin A1c  8. Need for pneumococcal vaccine  - Pneumococcal conjugate vaccine 20-valent (Prevnar 20) 9.  B12 deficiency B12 level if still low will start B12 parenterally  Return in about 6 months (around 08/17/2022).     I, RWilhemena Durie MD, have reviewed all documentation for this visit. The documentation on 02/16/22 for the exam, diagnosis, procedures, and orders are all accurate and complete.    Harlow Basley GCranford Mon MD  [phone](phone) [phone](fax)  [place]

## 2022-02-14 NOTE — Telephone Encounter (Signed)
Requested medication (s) are due for refill today: na   Requested medication (s) are on the active medication list: yes   Last refill:  01/18/22 #90 0 refills   Future visit scheduled: seen today and in 6 months  Notes to clinic:  do you want to allow #180 ?     Requested Prescriptions  Pending Prescriptions Disp Refills   allopurinol (ZYLOPRIM) 100 MG tablet [Pharmacy Med Name: ALLOPURINOL 100 MG TAB] 90 tablet 0    Sig: TAKE 1 TABLET BY MOUTH TWICE DAILY     Endocrinology:  Gout Agents - allopurinol Failed - 02/13/2022  6:38 PM      Failed - CBC within normal limits and completed in the last 12 months    WBC  Date Value Ref Range Status  02/03/2021 5.7 3.4 - 10.8 x10E3/uL Final  01/26/2013 12.5 (H) 4.0 - 10.5 K/uL Final   RBC  Date Value Ref Range Status  02/03/2021 4.63 4.14 - 5.80 x10E6/uL Final  01/26/2013 4.43 4.22 - 5.81 MIL/uL Final   Hemoglobin  Date Value Ref Range Status  02/03/2021 14.4 13.0 - 17.7 g/dL Final   Hematocrit  Date Value Ref Range Status  02/03/2021 41.6 37.5 - 51.0 % Final   MCHC  Date Value Ref Range Status  02/03/2021 34.6 31.5 - 35.7 g/dL Final  01/26/2013 33.7 30.0 - 36.0 g/dL Final   [facility]  Date Value Ref Range Status  02/03/2021 31.1 26.6 - 33.0 pg Final  01/26/2013 30.5 26.0 - 34.0 pg Final   MCV  Date Value Ref Range Status  02/03/2021 90 79 - 97 fL Final  01/25/2013 90 80 - 100 fL Final   No results found for: PLTCOUNTKUC, LABPLAT, POCPLA RDW  Date Value Ref Range Status  02/03/2021 13.5 11.6 - 15.4 % Final  01/25/2013 13.7 11.5 - 14.5 % Final         Passed - Uric Acid in normal range and within 360 days    Uric Acid  Date Value Ref Range Status  08/09/2021 5.8 3.8 - 8.4 mg/dL Final    Comment:               Therapeutic target for gout patients: <6.0         Passed - Cr in normal range and within 360 days    Creatinine  Date Value Ref Range Status  01/25/2013 1.74 (H) 0.60 - 1.30 mg/dL Final   Creatinine, Ser   Date Value Ref Range Status  08/09/2021 1.17 0.76 - 1.27 mg/dL Final         Passed - Valid encounter within last 12 months    Recent Outpatient Visits           Today Encounter for Medicare annual wellness exam   [facility] Jerrol Banana., MD   1 month ago Cellulitis of finger, unspecified laterality   [facility] [place], [place], PA-C   2 months ago Acute non-recurrent maxillary sinusitis   [employer], Dani Gobble, PA-C   6 months ago Essential hypertension   [facility] Jerrol Banana., MD   1 year ago Essential hypertension   [facility] Jerrol Banana., MD       Future Appointments             In 4 months Stoioff, Ronda Fairly, MD [place]   In 6 months Jerrol Banana., MD [facility], [place]

## 2022-02-16 LAB — LIPID PANEL
Chol/HDL Ratio: 6.9 ratio — ABNORMAL HIGH (ref 0.0–5.0)
Cholesterol, Total: 229 mg/dL — ABNORMAL HIGH (ref 100–199)
HDL: 33 mg/dL — ABNORMAL LOW (ref >39)
LDL Chol Calc (NIH): 152 mg/dL — ABNORMAL HIGH (ref 0–99)
Triglycerides: 236 mg/dL — ABNORMAL HIGH (ref 0–149)
VLDL Cholesterol Cal: 44 mg/dL — ABNORMAL HIGH (ref 5–40)

## 2022-02-16 LAB — COMPREHENSIVE METABOLIC PANEL
ALT: 39 IU/L (ref 0–44)
AST: 30 IU/L (ref 0–40)
Albumin/Globulin Ratio: 1.9 (ref 1.2–2.2)
Albumin: 4.7 g/dL (ref 3.8–4.8)
Alkaline Phosphatase: 123 IU/L — ABNORMAL HIGH (ref 44–121)
BUN/Creatinine Ratio: 13 (ref 10–24)
BUN: 17 mg/dL (ref 8–27)
Bilirubin Total: 0.7 mg/dL (ref 0.0–1.2)
CO2: 22 mmol/L (ref 20–29)
Calcium: 10 mg/dL (ref 8.6–10.2)
Chloride: 101 mmol/L (ref 96–106)
Creatinine, Ser: 1.26 mg/dL (ref 0.76–1.27)
Globulin, Total: 2.5 g/dL (ref 1.5–4.5)
Glucose: 126 mg/dL — ABNORMAL HIGH (ref 70–99)
Potassium: 4.5 mmol/L (ref 3.5–5.2)
Sodium: 141 mmol/L (ref 134–144)
Total Protein: 7.2 g/dL (ref 6.0–8.5)
eGFR: 63 mL/min/{1.73_m2} (ref >59)

## 2022-02-16 LAB — CBC WITH DIFFERENTIAL/PLATELET
Basophils Absolute: 0 10*3/uL (ref 0.0–0.2)
Basos: 1 %
EOS (ABSOLUTE): 0.2 10*3/uL (ref 0.0–0.4)
Eos: 3 %
Hematocrit: 43.4 % (ref 37.5–51.0)
Hemoglobin: 14.9 g/dL (ref 13.0–17.7)
Immature Grans (Abs): 0 10*3/uL (ref 0.0–0.1)
Immature Granulocytes: 1 %
Lymphocytes Absolute: 1.4 10*3/uL (ref 0.7–3.1)
Lymphs: 22 %
MCH: 31.2 pg (ref 26.6–33.0)
MCHC: 34.3 g/dL (ref 31.5–35.7)
MCV: 91 fL (ref 79–97)
Monocytes Absolute: 0.4 10*3/uL (ref 0.1–0.9)
Monocytes: 6 %
Neutrophils Absolute: 4.5 10*3/uL (ref 1.4–7.0)
Neutrophils: 67 %
Platelets: 248 10*3/uL (ref 150–450)
RBC: 4.78 x10E6/uL (ref 4.14–5.80)
RDW: 13.3 % (ref 11.6–15.4)
WBC: 6.6 10*3/uL (ref 3.4–10.8)

## 2022-02-16 LAB — TSH: TSH: 1.42 u[IU]/mL (ref 0.450–4.500)

## 2022-02-16 LAB — HEMOGLOBIN A1C
Est. average glucose Bld gHb Est-mCnc: 131 mg/dL
Hgb A1c MFr Bld: 6.2 % — ABNORMAL HIGH (ref 4.8–5.6)

## 2022-02-16 LAB — VITAMIN B12: Vitamin B-12: 685 pg/mL (ref 232–1245)

## 2022-04-04 ENCOUNTER — Other Ambulatory Visit: Payer: Self-pay | Admitting: Family Medicine

## 2022-04-23 ENCOUNTER — Ambulatory Visit: Payer: Self-pay | Admitting: *Deleted

## 2022-04-23 NOTE — Telephone Encounter (Signed)
Summary: Muscle spasm  - back   Muscle spasm in the back for 5 days requesting cyclobenzaprine 10 mg        Chief Complaint: middle back pain and muscle spasms. Requesting medication  Symptoms: moving handcart at work and immediately noted middle back pain left and right side. Started taking old RX cyclobenzaprine 10 mg and has been effective but continues to experience pain . Has taken meloxicam as well.  Frequency: 5 days  Pertinent Negatives: Patient denies inability to walk or move.  Disposition: '[]'$ ED /'[]'$ Urgent Care (no appt availability in office) / '[x]'$ Appointment(In office/virtual)/ '[]'$  [place] [facility]/ '[]'$ Home Care/ '[]'$ Refused Recommended Disposition /'[]'$ [place] [facility]/ '[]'$  Follow-up with PCP Additional Notes:   Requesting if PCP can prescribed medication to help with muscle spasms until being seen 04/26/22. Please advise and patient would like a call back today if possible.       Reason for Disposition  [1] MODERATE pain (e.g., interferes with normal activities) AND [2] present > 3 days  Answer Assessment - Initial Assessment Questions 1. ONSET: "When did the muscle aches or body pains start?"      Last Wednesday  2. LOCATION: "What part of your body is hurting?" (e.g., entire body, arms, legs)      Middle back  3. SEVERITY: "How bad is the pain?" (Scale 1-10; or mild, moderate, severe)   - MILD (1-3): doesn't interfere with normal activities    - MODERATE (4-7): interferes with normal activities or awakens from sleep    - SEVERE (8-10):  excruciating pain, unable to do any normal activities      Discomfort middle back  4. CAUSE: "What do you think is causing the pains?"     Moved a hand cart  and immediately had back pain while at work  5. FEVER: "Have you been having fever?"     na 6. OTHER SYMPTOMS: "Do you have any other symptoms?" (e.g., chest pain, weakness, rash, cold or flu symptoms, weight loss)     Middle back soreness and pain in muscles  7. PREGNANCY:  "Is there any chance you are pregnant?" "When was your last menstrual period?"     na 8. TRAVEL: "Have you traveled out of the country in the last month?" (e.g., travel history, exposures)     na  Protocols used: Muscle Aches and Body Pain-A-AH

## 2022-04-26 ENCOUNTER — Ambulatory Visit (INDEPENDENT_AMBULATORY_CARE_PROVIDER_SITE_OTHER): Payer: Medicare Other | Admitting: Family Medicine

## 2022-04-26 ENCOUNTER — Encounter: Payer: Self-pay | Admitting: Family Medicine

## 2022-04-26 VITALS — BP 132/85 | HR 84 | Resp 16 | Wt 291.0 lb

## 2022-04-26 DIAGNOSIS — G4733 Obstructive sleep apnea (adult) (pediatric): Secondary | ICD-10-CM | POA: Diagnosis not present

## 2022-04-26 DIAGNOSIS — M546 Pain in thoracic spine: Secondary | ICD-10-CM

## 2022-04-26 DIAGNOSIS — Z6838 Body mass index (BMI) 38.0-38.9, adult: Secondary | ICD-10-CM | POA: Diagnosis not present

## 2022-04-26 MED ORDER — CYCLOBENZAPRINE HCL 10 MG PO TABS: ORAL_TABLET | ORAL | 2 refills | Status: DC

## 2022-04-26 NOTE — Progress Notes (Signed)
Established patient visit  I,[name],acting as a scribe for [name], MD.,have documented all relevant documentation on the behalf of [name], MD,as directed by  [name], MD while in the presence of [name], MD.   Patient: Jose Lewis   DOB: [birth year]   66 y.o. Male  MRN: [id number] Visit Date: 04/26/2022  Today's healthcare provider: [name], MD   Chief Complaint  Patient presents with   Back Pain   Subjective      Patient was moving a filing cabinet one week ago and hurt his back. Back pain is located mid-back. Pain is constant. Patient is here to get Flexeril refilled.  Has no rash and no radiation of the pain.  Medications: Outpatient Medications Prior to Visit  Medication Sig   allopurinol (ZYLOPRIM) 100 MG tablet TAKE 1 TABLET BY MOUTH TWICE DAILY   amLODipine-benazepril (LOTREL) 10-40 MG capsule TAKE 1 CAPSULE BY MOUTH ONCE DAILY   cyanocobalamin 1000 MCG tablet Take 1,000 mcg by mouth daily.   Glucosamine-Chondroitin 250-200 MG TABS OSTEO BI-FLEX REGULAR STRENGTH, 250-'200MG'$  (Oral Tablet)  1 Every Day for 0 days  Quantity: 0.00;  Refills: 0   Ordered :27-Dec-2010  [name] ;  Started 29-Nov-2008 Active   Loratadine (CLARITIN PO) Take by mouth.   meclizine (ANTIVERT) 25 MG tablet Take by mouth. Reported on 03/01/2016   meloxicam (MOBIC) 15 MG tablet meloxicam 15 mg tablet  Take 1 tablet every day by oral route with meals.   omeprazole (PRILOSEC) 20 MG capsule Take 20 mg by mouth daily. Patient takes BRAND NAME PRILOSEC ONLY   Probiotic Product (PROBIOTIC-10 PO) Take by mouth daily.   sildenafil (REVATIO) 20 MG tablet TAKE THREE TO FIVE TABLETS BY MOUTH DAILY AS NEEDED.   cyclobenzaprine (FLEXERIL) 10 MG tablet 1 POQ 8 hrs prn muscle tightness   [DISCONTINUED] Levocetirizine Dihydrochloride (XYZAL ALLERGY 24HR PO) Take by mouth daily as needed. (Patient not taking: Reported on 04/26/2022)   No  facility-administered medications prior to visit.    Review of Systems  Constitutional:  Negative for appetite change, chills and fever.  Respiratory:  Negative for chest tightness, shortness of breath and wheezing.   Cardiovascular:  Negative for chest pain and palpitations.  Gastrointestinal:  Negative for abdominal pain, nausea and vomiting.  Musculoskeletal:  Positive for back pain.        Objective    BP 132/85 (BP Location: Right Arm, Patient Position: Sitting, Cuff Size: Large)   Pulse 84   Resp 16   Wt 291 lb (132 kg)   SpO2 94%   BMI 39.47 kg/m  BP Readings from Last 3 Encounters:  04/26/22 132/85  02/14/22 124/76  01/03/22 135/89   Wt Readings from Last 3 Encounters:  04/26/22 291 lb (132 kg)  02/14/22 290 lb (131.5 kg)  01/03/22 292 lb 3.2 oz (132.5 kg)      Physical Exam Vitals reviewed.  Constitutional:      General: He is not in acute distress.    Appearance: He is well-developed.  HENT:     Head: Normocephalic and atraumatic.     Right Ear: Hearing normal.     Left Ear: Hearing normal.     Nose: Nose normal.  Eyes:     General: Lids are normal. No scleral icterus.       Right eye: No discharge.        Left eye: No discharge.  Conjunctiva/sclera: Conjunctivae normal.  Cardiovascular:     Rate and Rhythm: Normal rate and regular rhythm.     Heart sounds: Normal heart sounds.  Pulmonary:     Effort: Pulmonary effort is normal. No respiratory distress.  Skin:    Findings: No lesion or rash.  Neurological:     General: No focal deficit present.     Mental Status: He is alert and oriented to person, place, and time.  Psychiatric:        Mood and Affect: Mood normal.        Speech: Speech normal.        Behavior: Behavior normal.        Thought Content: Thought content normal.        Judgment: Judgment normal.       No results found for any visits on 04/26/22.  Assessment & Plan     1. Acute bilateral thoracic back pain Patient is  slowly improving.  At this time we will add Flexeril 10 mg every 8 hours as needed with the caution that it could be sedating during the day.  He has been on this and noticed some sedation.  Use nonsteroidals cautiously  2. Class 2 severe obesity due to excess calories with serious comorbidity and body mass index (BMI) of 38.0 to 38.9 in adult ([place])   3. OSA (obstructive sleep apnea) CPAP nightly   No follow-ups on file.      I, [name], MD, have reviewed all documentation for this visit. The documentation on 04/27/22 for the exam, diagnosis, procedures, and orders are all accurate and complete.    Danny Yackley Cranford Mon, MD  [facility] [phone] (phone) [phone] (fax)  [place]

## 2022-06-12 ENCOUNTER — Telehealth: Payer: Self-pay | Admitting: Family Medicine

## 2022-06-12 NOTE — Telephone Encounter (Signed)
Copied from [place] [id number]. Topic: General - Other >> Jun 12, 2022  2:14 PM Everette C wrote: Reason for CRM: The patient would like to speak with a member of clinical staff about the RSV vaccine and potentially receiving one  The patient has additional questions they would like answered prior to scheduling   Please contact further when possible

## 2022-06-14 NOTE — Telephone Encounter (Signed)
Pt has called back and is waiting a response to his question  re RSV.

## 2022-06-14 NOTE — Telephone Encounter (Signed)
RSV indicated for adults over 40

## 2022-06-14 NOTE — Telephone Encounter (Signed)
LMOVM for pt to call back. Okay for pec triage to advise patient. Thanks.

## 2022-06-15 NOTE — Telephone Encounter (Signed)
Pt called, advised that RSV for pts > 67 yo. Pt asking to schedule appt for the vaccine. Advised pt I wasn't able to schedule but could call front office and see if they can schedule. Spoke with Anderson Malta, [place]. She states they dont have the vaccine at practice and pt could check with pharmacy. Informed pt of this info. Pt asked if HD would have and I told him to call and check and then check with pharmacy as well. Pt verbalized understanding.

## 2022-06-22 ENCOUNTER — Ambulatory Visit: Payer: Medicare Other | Admitting: Urology

## 2022-07-02 ENCOUNTER — Other Ambulatory Visit: Payer: Self-pay | Admitting: *Deleted

## 2022-07-02 DIAGNOSIS — N200 Calculus of kidney: Secondary | ICD-10-CM

## 2022-07-03 ENCOUNTER — Other Ambulatory Visit: Payer: Medicare Other

## 2022-07-03 ENCOUNTER — Other Ambulatory Visit: Payer: Self-pay

## 2022-07-03 DIAGNOSIS — Z8546 Personal history of malignant neoplasm of prostate: Secondary | ICD-10-CM

## 2022-07-04 LAB — PSA: Prostate Specific Ag, Serum: <0.1 ng/mL (ref 0.0–4.0)

## 2022-07-05 ENCOUNTER — Ambulatory Visit
Admission: RE | Admit: 2022-07-05 | Discharge: 2022-07-05 | Disposition: A | Discharge status: Home / Self Care | Payer: Medicare Other | Source: Ambulatory Visit | Attending: Urology | Admitting: Urology

## 2022-07-05 DIAGNOSIS — N200 Calculus of kidney: Secondary | ICD-10-CM

## 2022-07-06 ENCOUNTER — Ambulatory Visit (INDEPENDENT_AMBULATORY_CARE_PROVIDER_SITE_OTHER): Payer: Medicare Other | Admitting: Urology

## 2022-07-06 ENCOUNTER — Encounter: Payer: Self-pay | Admitting: Urology

## 2022-07-06 VITALS — BP 139/83 | HR 79 | Ht 70.0 in | Wt 290.0 lb

## 2022-07-06 DIAGNOSIS — N200 Calculus of kidney: Secondary | ICD-10-CM | POA: Diagnosis not present

## 2022-07-06 DIAGNOSIS — Z8546 Personal history of malignant neoplasm of prostate: Secondary | ICD-10-CM | POA: Diagnosis not present

## 2022-07-06 DIAGNOSIS — K4090 Unilateral inguinal hernia, without obstruction or gangrene, not specified as recurrent: Secondary | ICD-10-CM
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
## 2022-07-11 ENCOUNTER — Ambulatory Visit: Payer: Self-pay

## 2022-08-23 ENCOUNTER — Ambulatory Visit: Payer: Medicare Other | Admitting: Family Medicine

## 2022-08-24 ENCOUNTER — Encounter: Payer: Self-pay | Admitting: Family Medicine

## 2022-08-24 ENCOUNTER — Ambulatory Visit (INDEPENDENT_AMBULATORY_CARE_PROVIDER_SITE_OTHER): Payer: Medicare Other | Admitting: Family Medicine

## 2022-08-24 VITALS — BP 137/82 | HR 79 | Temp 97.8°F | Resp 16 | Ht 72.0 in | Wt 290.0 lb

## 2022-08-24 DIAGNOSIS — Z23 Encounter for immunization: Secondary | ICD-10-CM

## 2022-08-24 DIAGNOSIS — Z0000 Encounter for general adult medical examination without abnormal findings: Secondary | ICD-10-CM | POA: Diagnosis not present

## 2022-08-24 DIAGNOSIS — E782 Mixed hyperlipidemia: Secondary | ICD-10-CM | POA: Diagnosis not present

## 2022-08-24 DIAGNOSIS — I10 Essential (primary) hypertension: Secondary | ICD-10-CM

## 2022-08-24 MED ORDER — ROSUVASTATIN CALCIUM 40 MG PO TABS: 40.0000 mg | ORAL_TABLET | Freq: Every day | ORAL | 1 refills | Status: DC

## 2022-08-24 NOTE — Progress Notes (Signed)
I,Joseline E Rosas,acting as a scribe for [employer], MD.,have documented all relevant documentation on the behalf of [name], MD,as directed by  [name], MD while in the presence of [name], MD.   Established patient visit   Patient: Jose Lewis   DOB: March 05, 1956   66 y.o. Male  MRN: [id number] Visit Date: 08/24/2022  Today's healthcare provider: [name], MD   Chief Complaint  Patient presents with   Follow-Up Chronic Disease   Subjective    HPI  Hypertension, follow-up  BP Readings from Last 3 Encounters:  08/24/22 137/82  07/06/22 139/83  04/26/22 132/85   Wt Readings from Last 3 Encounters:  08/24/22 290 lb (131.5 kg)  07/06/22 290 lb (131.5 kg)  04/26/22 291 lb (132 kg)     He was last seen for hypertension 6 months ago.  BP at that visit was 124/76. Management since that visit includes continue with amlodipine-benazepril 10-40 mg daily. He reports excellent compliance with treatment.  Outside blood pressures are not being checked.  Lipid/Cholesterol, follow-up  Last Lipid Panel: Lab Results  Component Value Date   CHOL 229 (H) 02/15/2022   LDLCALC 152 (H) 02/15/2022   HDL 33 (L) 02/15/2022   TRIG 236 (H) 02/15/2022    He was last seen for this 6 months ago.  Management since that visit includes none.Not on medication.  Symptoms: No appetite changes Yes foot ulcerations  No chest pain No chest pressure/discomfort  No dyspnea No orthopnea  No fatigue No lower extremity edema  No palpitations No paroxysmal nocturnal dyspnea  No nausea Yes numbness or tingling of extremity-in the last month in half only in the AM.  No polydipsia No polyuria  No speech difficulty No syncope   Last metabolic panel Lab Results  Component Value Date   GLUCOSE 128 (H) 08/24/2022   NA 143 08/24/2022   K 4.5 08/24/2022   BUN 15 08/24/2022   CREATININE 1.11 08/24/2022   EGFR 73  08/24/2022   GFRNONAA 63 11/09/2020   CALCIUM 9.3 08/24/2022   AST 24 08/24/2022   ALT 30 08/24/2022   The 10-year ASCVD risk score (Arnett DK, et al., 2019) is: 25.2%  ---------------------------------------------------------------------------------------------------  Follow up for gout  The patient was last seen for this 6 months ago. Changes made at last visit include continue Allopurinol.   Medications: Outpatient Medications Prior to Visit  Medication Sig   allopurinol (ZYLOPRIM) 100 MG tablet TAKE 1 TABLET BY MOUTH TWICE DAILY   amLODipine-benazepril (LOTREL) 10-40 MG capsule TAKE 1 CAPSULE BY MOUTH ONCE DAILY   cyanocobalamin 1000 MCG tablet Take 1,000 mcg by mouth daily.   cyclobenzaprine (FLEXERIL) 10 MG tablet 1 POQ 8 hrs prn muscle tightness   Glucosamine-Chondroitin 250-200 MG TABS OSTEO BI-FLEX REGULAR STRENGTH, 250-200MG (Oral Tablet)  1 Every Day for 0 days  Quantity: 0.00;  Refills: 0   Ordered :27-Dec-2010  [name] ;  Started 29-Nov-2008 Active   Loratadine (CLARITIN PO) Take by mouth.   meclizine (ANTIVERT) 25 MG tablet Take by mouth. Reported on 03/01/2016   meloxicam (MOBIC) 15 MG tablet meloxicam 15 mg tablet  Take 1 tablet every day by oral route with meals.   omeprazole (PRILOSEC) 20 MG capsule Take 20 mg by mouth daily. Patient takes BRAND NAME PRILOSEC ONLY   Probiotic Product (PROBIOTIC-10 PO) Take by mouth daily.   sildenafil (REVATIO) 20 MG tablet TAKE THREE TO FIVE TABLETS BY MOUTH DAILY AS NEEDED.   No  facility-administered medications prior to visit.    Review of Systems  Constitutional:  Negative for fatigue.  Cardiovascular:  Negative for chest pain, palpitations and leg swelling.  Genitourinary:  Negative for frequency.  Neurological:  Positive for numbness. Negative for syncope.       Numbness in feet        Objective    BP 137/82 (BP Location: Right Arm, Patient Position: Sitting, Cuff Size: Large)   Pulse 79   Temp 97.8 F  (36.6 C) (Oral)   Resp 16   Ht 6' (1.829 m)   Wt 290 lb (131.5 kg)   SpO2 94%   BMI 39.33 kg/m    Physical Exam Vitals reviewed.  Constitutional:      General: He is not in acute distress.    Appearance: Normal appearance. He is not ill-appearing, toxic-appearing or diaphoretic.  Eyes:     Conjunctiva/sclera: Conjunctivae normal.  Cardiovascular:     Rate and Rhythm: Normal rate and regular rhythm.     Pulses: Normal pulses.     Heart sounds: Normal heart sounds. No murmur heard.    No friction rub. No gallop.  Pulmonary:     Effort: Pulmonary effort is normal. No respiratory distress.     Breath sounds: Normal breath sounds. No stridor. No wheezing, rhonchi or rales.  Abdominal:     General: Bowel sounds are normal. There is no distension.     Palpations: Abdomen is soft.     Tenderness: There is no abdominal tenderness.  Musculoskeletal:     Right lower leg: No edema.     Left lower leg: No edema.  Skin:    Findings: No erythema or rash.  Neurological:     Mental Status: He is alert and oriented to person, place, and time.     Results for orders placed or performed in visit on 08/24/22  Comprehensive metabolic panel  Result Value Ref Range   Glucose 128 (H) 70 - 99 mg/dL   BUN 15 8 - 27 mg/dL   Creatinine, Ser 1.11 0.76 - 1.27 mg/dL   eGFR 73 >59 mL/min/1.73   BUN/Creatinine Ratio 14 10 - 24   Sodium 143 134 - 144 mmol/L   Potassium 4.5 3.5 - 5.2 mmol/L   Chloride 106 96 - 106 mmol/L   CO2 22 20 - 29 mmol/L   Calcium 9.3 8.6 - 10.2 mg/dL   Total Protein 7.0 6.0 - 8.5 g/dL   Albumin 4.5 3.9 - 4.9 g/dL   Globulin, Total 2.5 1.5 - 4.5 g/dL   Albumin/Globulin Ratio 1.8 1.2 - 2.2   Bilirubin Total 0.4 0.0 - 1.2 mg/dL   Alkaline Phosphatase 120 44 - 121 IU/L   AST 24 0 - 40 IU/L   ALT 30 0 - 44 IU/L     Assessment & Plan     Problem List Items Addressed This Visit       Cardiovascular and Mediastinum   Essential (primary) hypertension    BP within goal  range  He will continue current medications at current doses  CMP repeated today  Follow up in 3 months       Relevant Medications   rosuvastatin (CRESTOR) 40 MG tablet     Other   HLD (hyperlipidemia) - Primary    Mixed HLD Discussed recommendations for lifestyle modifications including dietary changes, see AVS given to patient  Recommended starting statin therapy  Will prescribe crestor 4m daily  Will obtain baseline CMP today  Discussed myalgias and elevated liver enzymes as potential SE, patient voiced understanding  Will follow up in 3 months for repeat lipid panel (recommended that he fast prior to labs) and CMP        Relevant Medications   rosuvastatin (CRESTOR) 40 MG tablet   Other Relevant Orders   Comprehensive metabolic panel (Completed)   Healthcare maintenance    Influenza vaccine was administered today  Patient tolerated well   Recommended Shingrix vaccine at pharmacy       Other Visit Diagnoses     Need for influenza vaccination       Relevant Orders   Flu Vaccine QUAD High Dose(Fluad) (Completed)        Return in about 3 months (around 11/23/2022) for cholesterol .      I, [name], MD, have reviewed all documentation for this visit.  Portions of this information were initially documented by the CMA and reviewed by me for thoroughness and accuracy.      [name], MD  [facility] [phone] (phone) [phone] (fax)  [place]

## 2022-08-24 NOTE — Assessment & Plan Note (Signed)
Mixed HLD Discussed recommendations for lifestyle modifications including dietary changes, see AVS given to patient  Recommended starting statin therapy  Will prescribe crestor '40mg'$  daily  Will obtain baseline CMP today  Discussed myalgias and elevated liver enzymes as potential SE, patient voiced understanding  Will follow up in 3 months for repeat lipid panel (recommended that he fast prior to labs) and CMP

## 2022-08-24 NOTE — Assessment & Plan Note (Signed)
BP within goal range  He will continue current medications at current doses  CMP repeated today  Follow up in 3 months

## 2022-08-24 NOTE — Patient Instructions (Signed)

## 2022-08-25 LAB — COMPREHENSIVE METABOLIC PANEL
ALT: 30 IU/L (ref 0–44)
AST: 24 IU/L (ref 0–40)
Albumin/Globulin Ratio: 1.8 (ref 1.2–2.2)
Albumin: 4.5 g/dL (ref 3.9–4.9)
Alkaline Phosphatase: 120 IU/L (ref 44–121)
BUN/Creatinine Ratio: 14 (ref 10–24)
BUN: 15 mg/dL (ref 8–27)
Bilirubin Total: 0.4 mg/dL (ref 0.0–1.2)
CO2: 22 mmol/L (ref 20–29)
Calcium: 9.3 mg/dL (ref 8.6–10.2)
Chloride: 106 mmol/L (ref 96–106)
Creatinine, Ser: 1.11 mg/dL (ref 0.76–1.27)
Globulin, Total: 2.5 g/dL (ref 1.5–4.5)
Glucose: 128 mg/dL — ABNORMAL HIGH (ref 70–99)
Potassium: 4.5 mmol/L (ref 3.5–5.2)
Sodium: 143 mmol/L (ref 134–144)
Total Protein: 7 g/dL (ref 6.0–8.5)
eGFR: 73 mL/min/{1.73_m2} (ref >59)

## 2022-08-28 ENCOUNTER — Telehealth: Payer: Self-pay | Admitting: Family Medicine

## 2022-08-28 NOTE — Telephone Encounter (Signed)
Pt was given medication on 12/1 at appt with labs and told the results would determine wether to take meds or not. Lab results have been seen by dr but pt does not know and no one has contacted him and he is a bit worried not been notified. Pls fu to advise [phone]

## 2022-08-31 ENCOUNTER — Encounter: Payer: Self-pay | Admitting: Family Medicine

## 2022-08-31 DIAGNOSIS — Z0000 Encounter for general adult medical examination without abnormal findings: Secondary | ICD-10-CM | POA: Insufficient documentation

## 2022-08-31 NOTE — Assessment & Plan Note (Signed)
Influenza vaccine was administered today  Patient tolerated well   Recommended Shingrix vaccine at pharmacy

## 2022-10-24 ENCOUNTER — Other Ambulatory Visit: Payer: Self-pay | Admitting: Family Medicine

## 2022-10-24 DIAGNOSIS — I10 Essential (primary) hypertension: Secondary | ICD-10-CM

## 2022-10-24 MED ORDER — CYCLOBENZAPRINE HCL 10 MG PO TABS: ORAL_TABLET | ORAL | 1 refills | Status: AC

## 2022-10-24 MED ORDER — AMLODIPINE BESY-BENAZEPRIL HCL 10-40 MG PO CAPS: 1.0000 | ORAL_CAPSULE | Freq: Every day | ORAL | 3 refills | Status: DC

## 2022-10-24 NOTE — Telephone Encounter (Signed)
[facility] faxed refill request for the following medications:   cyclobenzaprine (FLEXERIL) 10 MG tablet  amLODipine-benazepril (LOTREL) 10-40 MG capsule   Please advise

## 2022-11-21 ENCOUNTER — Telehealth: Payer: Self-pay

## 2022-11-21 DIAGNOSIS — Z1159 Encounter for screening for other viral diseases: Secondary | ICD-10-CM

## 2022-11-21 DIAGNOSIS — E538 Deficiency of other specified B group vitamins: Secondary | ICD-10-CM

## 2022-11-21 DIAGNOSIS — R7303 Prediabetes: Secondary | ICD-10-CM

## 2022-11-21 DIAGNOSIS — E782 Mixed hyperlipidemia: Secondary | ICD-10-CM

## 2022-11-21 DIAGNOSIS — I10 Essential (primary) hypertension: Secondary | ICD-10-CM

## 2022-11-21 NOTE — Telephone Encounter (Signed)
[facility] for orders. Please instruct patient to fast for 8 hours prior to labs   [name], MD  [facility]

## 2022-11-21 NOTE — Telephone Encounter (Signed)
Copied from [place] [id number]. Topic: Appointment Scheduling - Scheduling Inquiry for Clinic >> Nov 21, 2022 11:33 AM Sabas Sous wrote: Reason for CRM: Pt wants to have labs prior to his appt, please advise when lab orders are ready and pt wants to know if he needs to fast for his labs.   Best contact: [phone]  Pt wants to come Friday morning for labs

## 2022-11-21 NOTE — Addendum Note (Signed)
Addended by: Barnie Mort on: 11/21/2022 02:51 PM   Modules accepted: Orders

## 2022-11-22 NOTE — Telephone Encounter (Signed)
Labs reordered for patient.   B12 CBC CMP Lipid panel  A1c  Hep C screening    Jose Foster, MD  [facility]

## 2022-11-22 NOTE — Addendum Note (Signed)
Addended by: Adolph Pollack on: 11/22/2022 04:58 PM   Modules accepted: Orders

## 2022-11-22 NOTE — Telephone Encounter (Signed)
Looks like the labs got that were pending got canceled. Can you review this please

## 2022-11-23 DIAGNOSIS — Z1159 Encounter for screening for other viral diseases: Secondary | ICD-10-CM | POA: Diagnosis not present

## 2022-11-23 DIAGNOSIS — R7303 Prediabetes: Secondary | ICD-10-CM | POA: Diagnosis not present

## 2022-11-23 DIAGNOSIS — I10 Essential (primary) hypertension: Secondary | ICD-10-CM | POA: Diagnosis not present

## 2022-11-23 DIAGNOSIS — E782 Mixed hyperlipidemia: Secondary | ICD-10-CM | POA: Diagnosis not present

## 2022-11-23 DIAGNOSIS — E538 Deficiency of other specified B group vitamins: Secondary | ICD-10-CM | POA: Diagnosis not present

## 2022-11-24 LAB — CBC
Hematocrit: 41.1 % (ref 37.5–51.0)
Hemoglobin: 13.9 g/dL (ref 13.0–17.7)
MCH: 30.8 pg (ref 26.6–33.0)
MCHC: 33.8 g/dL (ref 31.5–35.7)
MCV: 91 fL (ref 79–97)
Platelets: 220 10*3/uL (ref 150–450)
RBC: 4.51 x10E6/uL (ref 4.14–5.80)
RDW: 13.3 % (ref 11.6–15.4)
WBC: 5.8 10*3/uL (ref 3.4–10.8)

## 2022-11-24 LAB — COMPREHENSIVE METABOLIC PANEL
ALT: 25 IU/L (ref 0–44)
AST: 19 IU/L (ref 0–40)
Albumin/Globulin Ratio: 2.3 — ABNORMAL HIGH (ref 1.2–2.2)
Albumin: 4.8 g/dL (ref 3.9–4.9)
Alkaline Phosphatase: 112 IU/L (ref 44–121)
BUN/Creatinine Ratio: 14 (ref 10–24)
BUN: 17 mg/dL (ref 8–27)
Bilirubin Total: 0.6 mg/dL (ref 0.0–1.2)
CO2: 22 mmol/L (ref 20–29)
Calcium: 9.3 mg/dL (ref 8.6–10.2)
Chloride: 103 mmol/L (ref 96–106)
Creatinine, Ser: 1.21 mg/dL (ref 0.76–1.27)
Globulin, Total: 2.1 g/dL (ref 1.5–4.5)
Glucose: 116 mg/dL — ABNORMAL HIGH (ref 70–99)
Potassium: 4.8 mmol/L (ref 3.5–5.2)
Sodium: 139 mmol/L (ref 134–144)
Total Protein: 6.9 g/dL (ref 6.0–8.5)
eGFR: 66 mL/min/{1.73_m2} (ref >59)

## 2022-11-24 LAB — LIPID PANEL
Chol/HDL Ratio: 3.2 ratio (ref 0.0–5.0)
Cholesterol, Total: 120 mg/dL (ref 100–199)
HDL: 37 mg/dL — ABNORMAL LOW (ref >39)
LDL Chol Calc (NIH): 54 mg/dL (ref 0–99)
Triglycerides: 170 mg/dL — ABNORMAL HIGH (ref 0–149)
VLDL Cholesterol Cal: 29 mg/dL (ref 5–40)

## 2022-11-24 LAB — HEPATITIS C ANTIBODY: Hep C Virus Ab: NONREACTIVE

## 2022-11-24 LAB — VITAMIN B12: Vitamin B-12: 1065 pg/mL (ref 232–1245)

## 2022-11-24 LAB — HEMOGLOBIN A1C
Est. average glucose Bld gHb Est-mCnc: 134 mg/dL
Hgb A1c MFr Bld: 6.3 % — ABNORMAL HIGH (ref 4.8–5.6)

## 2022-11-27 DIAGNOSIS — R7303 Prediabetes: Secondary | ICD-10-CM | POA: Insufficient documentation

## 2022-11-27 NOTE — Progress Notes (Signed)
I,Jose Lewis,acting as a scribe for [employer], MD.,have documented all relevant documentation on the behalf of Jose Foster, MD,as directed by  Jose Foster, MD while in the presence of Jose Foster, MD.   Established patient visit   Patient: Jose Lewis   DOB: 12/31/1955   67 y.o. Male  MRN: [id number] Visit Date: 11/28/2022  Today's healthcare provider: [name], MD   Chief Complaint  Patient presents with   Follow-up   Subjective    HPI  Hypertension, follow-up  BP Readings from Last 3 Encounters:  11/28/22 110/71  08/24/22 137/82  07/06/22 139/83   Wt Readings from Last 3 Encounters:  11/28/22 288 lb 6.4 oz (130.8 kg)  08/24/22 290 lb (131.5 kg)  07/06/22 290 lb (131.5 kg)     He was last seen for hypertension 3 months ago.  BP at that visit was 137/82. Management since that visit includes continue current medications at current doses . He reports excellent compliance with treatment.  Outside blood pressures are not being checked.  He does not smoke.  --------------------------------------------------------------------------------------------------- Lipid/Cholesterol, follow-up  Last Lipid Panel: Lab Results  Component Value Date   CHOL 120 11/23/2022   LDLCALC 54 11/23/2022   HDL 37 (L) 11/23/2022   TRIG 170 (H) 11/23/2022      He was last seen for this 3 months ago.  Management since that visit includes prescribed crestor '40mg'$  daily .  He reports excellent compliance with treatment. He is not having side effects.  Reports that he has joint pain not sure if the pain is coming from his joint or muscle. He also reports having a shingles vaccine a month ago and his arm was sore for more than a week.  Symptoms: No appetite changes No foot ulcerations  No chest pain No chest pressure/discomfort  No dyspnea No orthopnea  No fatigue No lower extremity edema  No  palpitations No paroxysmal nocturnal dyspnea  No nausea No numbness or tingling of extremity  No polydipsia No polyuria  No speech difficulty No syncope   Last metabolic panel Lab Results  Component Value Date   GLUCOSE 116 (H) 11/23/2022   NA 139 11/23/2022   K 4.8 11/23/2022   BUN 17 11/23/2022   CREATININE 1.21 11/23/2022   EGFR 66 11/23/2022   GFRNONAA 63 11/09/2020   CALCIUM 9.3 11/23/2022   AST 19 11/23/2022   ALT 25 11/23/2022   The ASCVD Risk score (Arnett DK, et al., 2019) failed to calculate for the following reasons:   The valid total cholesterol range is 130 to 320 mg/dL  ---------------------------------------------------------------------------------------------------   Medications: Outpatient Medications Prior to Visit  Medication Sig   allopurinol (ZYLOPRIM) 100 MG tablet TAKE 1 TABLET BY MOUTH TWICE DAILY   amLODipine-benazepril (LOTREL) 10-40 MG capsule Take 1 capsule by mouth daily.   cyanocobalamin 1000 MCG tablet Take 1,000 mcg by mouth daily.   cyclobenzaprine (FLEXERIL) 10 MG tablet 1 POQ 8 hrs prn muscle tightness   Glucosamine-Chondroitin 250-200 MG TABS OSTEO BI-FLEX REGULAR STRENGTH, 250-'200MG'$  (Oral Tablet)  1 Every Day for 0 days  Quantity: 0.00;  Refills: 0   Ordered :27-Dec-2010  [name] ;  Started 29-Nov-2008 Active   Loratadine (CLARITIN PO) Take by mouth.   meclizine (ANTIVERT) 25 MG tablet Take by mouth. Reported on 03/01/2016   meloxicam (MOBIC) 15 MG tablet meloxicam 15 mg tablet  Take 1 tablet every day by oral route with meals.   omeprazole (PRILOSEC) 20 MG  capsule Take 20 mg by mouth daily. Patient takes BRAND NAME PRILOSEC ONLY   Probiotic Product (PROBIOTIC-10 PO) Take by mouth daily.   rosuvastatin (CRESTOR) 40 MG tablet Take 1 tablet (40 mg total) by mouth daily.   sildenafil (REVATIO) 20 MG tablet TAKE THREE TO FIVE TABLETS BY MOUTH DAILY AS NEEDED.   No facility-administered medications prior to visit.    Review of  Systems     Objective    BP 110/71 (BP Location: Left Arm, Patient Position: Sitting, Cuff Size: Large)   Pulse 67   Resp 16   Wt 288 lb 6.4 oz (130.8 kg)   BMI 39.11 kg/m    Physical Exam Vitals reviewed.  Constitutional:      General: He is not in acute distress.    Appearance: Normal appearance. He is not ill-appearing, toxic-appearing or diaphoretic.  Eyes:     Conjunctiva/sclera: Conjunctivae normal.  Neck:     Thyroid: No thyroid mass, thyromegaly or thyroid tenderness.     Vascular: No carotid bruit.  Cardiovascular:     Rate and Rhythm: Normal rate and regular rhythm.     Pulses: Normal pulses.     Heart sounds: Normal heart sounds. No murmur heard.    No friction rub. No gallop.  Pulmonary:     Effort: Pulmonary effort is normal. No respiratory distress.     Breath sounds: Normal breath sounds. No stridor. No wheezing, rhonchi or rales.  Abdominal:     General: Bowel sounds are normal. There is no distension.     Palpations: Abdomen is soft.     Tenderness: There is no abdominal tenderness.  Musculoskeletal:     Right lower leg: 1+ Edema present.     Left lower leg: 1+ Edema present.  Lymphadenopathy:     Cervical: No cervical adenopathy.  Skin:    Findings: No erythema or rash.  Neurological:     Mental Status: He is alert and oriented to person, place, and time.      No results found for any visits on 11/28/22.  Assessment & Plan     Problem List Items Addressed This Visit       Cardiovascular and Mediastinum   Essential (primary) hypertension - Primary    Controlled BP at goal Continue current medications: amlodipine -benazepril '10mg'$ -'40mg'$  No medications changes today  Reviewed CMP prior to appoint, values within normal range for potassium and creatinine           Other   HLD (hyperlipidemia)    Chronic problem  Stable  Continue current medication: crestor '40mg'$  daily  No medication changes today  Reviewed lipid panel from 11/23/22 with  patient and discussed dietary suggestions to lower TG of 170 and increased HDL  Advised patient to increase physical activity with goal of 150 mins per week of moderate aerobic exercise, patient reports ankle pain that limits physical activity  Advised to limit intake of\ sugary beverages, rice, pasta, bread and desserts         Prediabetes     Return in about 2 months (around 02/04/2023) for AWV.       The entirety of the information documented in the History of Present Illness, Review of Systems and Physical Exam were personally obtained by me. Portions of this information were initially documented by Lyndel Pleasure, CMA . I, Jose Foster, MD have reviewed the documentation above for thoroughness and accuracy.      Jose Foster, MD  [place]  [phone] (phone) [phone] (fax)  [place]

## 2022-11-28 ENCOUNTER — Ambulatory Visit (INDEPENDENT_AMBULATORY_CARE_PROVIDER_SITE_OTHER): Payer: Medicare HMO | Admitting: Family Medicine

## 2022-11-28 ENCOUNTER — Encounter: Payer: Self-pay | Admitting: Family Medicine

## 2022-11-28 VITALS — BP 110/71 | HR 67 | Resp 16 | Wt 288.4 lb

## 2022-11-28 DIAGNOSIS — R7303 Prediabetes: Secondary | ICD-10-CM | POA: Diagnosis not present

## 2022-11-28 DIAGNOSIS — I10 Essential (primary) hypertension: Secondary | ICD-10-CM | POA: Diagnosis not present

## 2022-11-28 DIAGNOSIS — E782 Mixed hyperlipidemia: Secondary | ICD-10-CM | POA: Diagnosis not present

## 2022-11-28 NOTE — Patient Instructions (Signed)
It was a pleasure to see you today!  Thank you for choosing [facility] for your primary care.   Jose Lewis was seen for blood pressure, cholesterol and prediabetes.   Our plans for today were: Please continue your crestor '40mg'$  and be on the lookout for any increased symptoms of sore muscles.  Please continue your blood pressure medications as prescribed.     You should return to our clinic in 2 months for your annual wellness visit and follow up for cholesterol, prediabetes.   Best Wishes,   Dr. Quentin Cornwall    Preventing Type 2 Diabetes Mellitus Type 2 diabetes, also called type 2 diabetes mellitus, is a long-term (chronic) disease that affects sugar (glucose) levels in your blood. Normally, a hormone called insulin allows glucose to enter cells in your body. The cells use glucose for energy. With type 2 diabetes, you will have one or both of these problems: Your pancreas does not make enough insulin. Cells in your body do not respond properly to insulin that your body makes (insulin resistance). Insulin resistance or lack of insulin causes extra glucose to build up in the blood instead of going into cells. As a result, high blood glucose (hyperglycemia) develops. That can cause many complications. Being overweight or obese and having an inactive (sedentary) lifestyle can increase your risk for diabetes. Type 2 diabetes can be delayed or prevented by making certain nutrition and lifestyle changes. How can this condition affect me? If you do not take steps to prevent diabetes, your blood glucose levels may keep increasing over time. Too much glucose in your blood for a long time can damage your blood vessels, heart, kidneys, nerves, and eyes. Type 2 diabetes can lead to chronic health problems and complications, such as: Heart disease. Stroke. Blindness. Kidney disease. Depression. Poor circulation in your feet and legs. In severe cases, a foot or leg may need to  be surgically removed (amputated). What can increase my risk? You may be more likely to develop type 2 diabetes if you: Have type 2 diabetes in your family. Are overweight or obese. Have a sedentary lifestyle. Have insulin resistance or a history of prediabetes. Have a history of pregnancy-related (gestational) diabetes or polycystic ovary syndrome (PCOS). What actions can I take to prevent this? It can be difficult to recognize signs of type 2 diabetes. Taking action to prevent the disease before you develop symptoms is the best way to avoid possible damage to your body. Making certain nutrition and lifestyle changes may prevent or delay the disease and related health problems. Nutrition  Eat healthy meals and snacks regularly. Do not skip meals. Fruit or a handful of nuts is a healthy snack between meals. Drink water throughout the day. Avoid drinks that contain added sugar, such as soda or sweetened tea. Drink enough fluid to keep your urine pale yellow. Follow instructions from your health care provider about eating or drinking restrictions. Limit the amount of food you eat by: Managing how much you eat at a time (portion size). Checking food labels for the serving sizes of food. Using a kitchen scale to weigh amounts of food. Saut or steam food instead of frying it. Cook with water or broth instead of oils or butter. Limit saturated fat and salt (sodium) in your diet. Have no more than 1 tsp (2,400 mg) of sodium a day. If you have heart disease or high blood pressure, use less than ? tsp (1,500 mg) of sodium a day. Lifestyle  Lose weight if needed and as told. Your health care provider can determine how much weight loss is best for you and can help you lose weight safely. If you are overweight or obese, you may be told to lose at least 5?7% of your body weight. Manage blood pressure, cholesterol, and stress. Your health care provider will help determine the best treatment for  you. Do not use any products that contain nicotine or tobacco. These products include cigarettes, chewing tobacco, and vaping devices, such as e-cigarettes. If you need help quitting, ask your health care provider. Activity  Do physical activity that makes your heart beat faster and makes you sweat (moderate intensity). Do this for at least 30 minutes on at least 5 days of the week, or as much as told by your health care provider. Ask your health care provider what activities are safe for you. A mix of activities may be best, such as walking, swimming, cycling, and strength training. Try to add physical activity into your day. For example: Park your car farther away than usual so that you walk more. Take a walk during your lunch break. Use stairs instead of elevators or escalators. Walk or bike to work instead of driving. Alcohol use If you drink alcohol: Limit how much you have to: 0?1 drink a day for women who are not pregnant. 0?2 drinks a day for men. Know how much alcohol is in your drink. In the [place] one drink equals one 12 oz bottle of beer (355 mL), one 5 oz glass of wine (148 mL), or one 1 oz glass of hard liquor (44 mL). General information Talk with your health care provider about your risk factors and how you can reduce your risk for diabetes. Have your blood glucose tested regularly, as told by your health care provider. Get screening tests as told by your health care provider. You may have these regularly, especially if you have certain risk factors for type 2 diabetes. Make an appointment with a registered dietitian. This diet and nutrition specialist can help you make a healthy eating plan and help you understand portion sizes and food labels. Where to find support Ask your health care provider to recommend a registered dietitian, a certified diabetes care and education specialist, or a weight loss program. Look for local or online weight loss groups. Join a gym, fitness  club, or outdoor activity group, such as a walking club. Where to find more information For help and guidance and to learn more about diabetes and diabetes prevention, visit: American Diabetes Association (ADA): www.diabetes.[employer] of Diabetes and Digestive and Kidney Diseases: [employer] To learn more about healthy eating, visit: U.S. Department of Agriculture Scientist, research (physical sciences)): FormerBoss.no Office of Disease Prevention and Health Promotion (ODPHP): [place] Summary You can delay or prevent type 2 diabetes by eating healthy foods, losing weight if needed, and increasing your physical activity. Talk with your health care provider about your risk factors for type 2 diabetes and how you can reduce your risk. It can be difficult to recognize the signs of type 2 diabetes. The best way to avoid possible damage to your body is to take action to prevent the disease before you develop symptoms. Get screening tests as told by your health care provider. This information is not intended to replace advice given to you by your health care provider. Make sure you discuss any questions you have with your health care provider. Document Revised: 12/05/2020 Document Reviewed: 12/05/2020 Elsevier Patient Education  2023 Elsevier  Inc.  

## 2022-11-30 NOTE — Assessment & Plan Note (Addendum)
Controlled BP at goal Continue current medications: amlodipine -benazepril '10mg'$ -'40mg'$  No medications changes today  Reviewed CMP prior to appoint, values within normal range for potassium and creatinine

## 2022-11-30 NOTE — Assessment & Plan Note (Signed)
Chronic problem  Stable  Continue current medication: crestor '40mg'$  daily  No medication changes today  Reviewed lipid panel from 11/23/22 with patient and discussed dietary suggestions to lower TG of 170 and increased HDL  Advised patient to increase physical activity with goal of 150 mins per week of moderate aerobic exercise, patient reports ankle pain that limits physical activity  Advised to limit intake of\ sugary beverages, rice, pasta, bread and desserts

## 2023-02-11 DIAGNOSIS — M19072 Primary osteoarthritis, left ankle and foot: Secondary | ICD-10-CM | POA: Diagnosis not present

## 2023-02-27 ENCOUNTER — Encounter: Payer: Self-pay | Admitting: Family Medicine

## 2023-02-27 ENCOUNTER — Other Ambulatory Visit: Payer: Self-pay | Admitting: Family Medicine

## 2023-02-27 ENCOUNTER — Ambulatory Visit (INDEPENDENT_AMBULATORY_CARE_PROVIDER_SITE_OTHER): Payer: Medicare HMO | Admitting: Family Medicine

## 2023-02-27 VITALS — BP 126/84 | HR 67 | Ht 71.0 in | Wt 283.5 lb

## 2023-02-27 DIAGNOSIS — I10 Essential (primary) hypertension: Secondary | ICD-10-CM | POA: Diagnosis not present

## 2023-02-27 DIAGNOSIS — K439 Ventral hernia without obstruction or gangrene: Secondary | ICD-10-CM | POA: Insufficient documentation

## 2023-02-27 DIAGNOSIS — E782 Mixed hyperlipidemia: Secondary | ICD-10-CM

## 2023-02-27 DIAGNOSIS — L918 Other hypertrophic disorders of the skin: Secondary | ICD-10-CM | POA: Diagnosis not present

## 2023-02-27 DIAGNOSIS — Z1159 Encounter for screening for other viral diseases: Secondary | ICD-10-CM

## 2023-02-27 DIAGNOSIS — Z6839 Body mass index (BMI) 39.0-39.9, adult: Secondary | ICD-10-CM

## 2023-02-27 DIAGNOSIS — M109 Gout, unspecified: Secondary | ICD-10-CM

## 2023-02-27 DIAGNOSIS — G4733 Obstructive sleep apnea (adult) (pediatric): Secondary | ICD-10-CM | POA: Diagnosis not present

## 2023-02-27 DIAGNOSIS — Z0000 Encounter for general adult medical examination without abnormal findings: Secondary | ICD-10-CM | POA: Insufficient documentation

## 2023-02-27 DIAGNOSIS — E669 Obesity, unspecified: Secondary | ICD-10-CM | POA: Diagnosis not present

## 2023-02-27 DIAGNOSIS — E538 Deficiency of other specified B group vitamins: Secondary | ICD-10-CM

## 2023-02-27 DIAGNOSIS — E559 Vitamin D deficiency, unspecified: Secondary | ICD-10-CM | POA: Diagnosis not present

## 2023-02-27 DIAGNOSIS — R7303 Prediabetes: Secondary | ICD-10-CM

## 2023-02-27 NOTE — Patient Instructions (Signed)
Health Maintenance After Age 68 After age 68, you are at a higher risk for certain long-term diseases and infections as well as injuries from falls. Falls are a major cause of broken bones and head injuries in people who are older than age 68. Getting regular preventive care can help to keep you healthy and well. Preventive care includes getting regular testing and making lifestyle changes as recommended by your health care provider. Talk with your health care provider about: Which screenings and tests you should have. A screening is a test that checks for a disease when you have no symptoms. A diet and exercise plan that is right for you. What should I know about screenings and tests to prevent falls? Screening and testing are the best ways to find a health problem early. Early diagnosis and treatment give you the best chance of managing medical conditions that are common after age 68. Certain conditions and lifestyle choices may make you more likely to have a fall. Your health care provider may recommend: Regular vision checks. Poor vision and conditions such as cataracts can make you more likely to have a fall. If you wear glasses, make sure to get your prescription updated if your vision changes. Medicine review. Work with your health care provider to regularly review all of the medicines you are taking, including over-the-counter medicines. Ask your health care provider about any side effects that may make you more likely to have a fall. Tell your health care provider if any medicines that you take make you feel dizzy or sleepy. Strength and balance checks. Your health care provider may recommend certain tests to check your strength and balance while standing, walking, or changing positions. Foot health exam. Foot pain and numbness, as well as not wearing proper footwear, can make you more likely to have a fall. Screenings, including: Osteoporosis screening. Osteoporosis is a condition that causes  the bones to get weaker and break more easily. Blood pressure screening. Blood pressure changes and medicines to control blood pressure can make you feel dizzy. Depression screening. You may be more likely to have a fall if you have a fear of falling, feel depressed, or feel unable to do activities that you used to do. Alcohol use screening. Using too much alcohol can affect your balance and may make you more likely to have a fall. Follow these instructions at home: Lifestyle Do not drink alcohol if: Your health care provider tells you not to drink. If you drink alcohol: Limit how much you have to: 0-1 drink a day for women. 0-2 drinks a day for men. Know how much alcohol is in your drink. In the [place] one drink equals one 12 oz bottle of beer (355 mL), one 5 oz glass of wine (148 mL), or one 1 oz glass of hard liquor (44 mL). Do not use any products that contain nicotine or tobacco. These products include cigarettes, chewing tobacco, and vaping devices, such as e-cigarettes. If you need help quitting, ask your health care provider. Activity  Follow a regular exercise program to stay fit. This will help you maintain your balance. Ask your health care provider what types of exercise are appropriate for you. If you need a cane or walker, use it as recommended by your health care provider. Wear supportive shoes that have nonskid soles. Safety  Remove any tripping hazards, such as rugs, cords, and clutter. Install safety equipment such as grab bars in bathrooms and safety rails on stairs. Keep rooms and walkways   well-lit. General instructions Talk with your health care provider about your risks for falling. Tell your health care provider if: You fall. Be sure to tell your health care provider about all falls, even ones that seem minor. You feel dizzy, tiredness (fatigue), or off-balance. Take over-the-counter and prescription medicines only as told by your health care provider. These include  supplements. Eat a healthy diet and maintain a healthy weight. A healthy diet includes low-fat dairy products, low-fat (lean) meats, and fiber from whole grains, beans, and lots of fruits and vegetables. Stay current with your vaccines. Schedule regular health, dental, and eye exams. Summary Having a healthy lifestyle and getting preventive care can help to protect your health and wellness after age 68. Screening and testing are the best way to find a health problem early and help you avoid having a fall. Early diagnosis and treatment give you the best chance for managing medical conditions that are more common for people who are older than age 68. Falls are a major cause of broken bones and head injuries in people who are older than age 68. Take precautions to prevent a fall at home. Work with your health care provider to learn what changes you can make to improve your health and wellness and to prevent falls. This information is not intended to replace advice given to you by your health care provider. Make sure you discuss any questions you have with your health care provider. Document Revised: 01/30/2021 Document Reviewed: 01/30/2021 Elsevier Patient Education  2024 Elsevier Inc.  

## 2023-02-27 NOTE — Assessment & Plan Note (Signed)
Chronic  Stable  BP well controlled today  No medication changes  CMP collected today

## 2023-02-27 NOTE — Assessment & Plan Note (Signed)
Chronic  BMI 39  Will check A1c, CMP, CBC, lipid panel

## 2023-02-27 NOTE — Assessment & Plan Note (Signed)
Chronic  Stable  Recommended checking vitamin D levels with labs  Vitamin D ordered

## 2023-02-27 NOTE — Telephone Encounter (Signed)
Labs in date  Requested Prescriptions  Pending Prescriptions Disp Refills   rosuvastatin (CRESTOR) 40 MG tablet [Pharmacy Med Name: ROSUVASTATIN CALCIUM 40 MG TAB] 90 tablet 1    Sig: TAKE 1 TABLET BY MOUTH ONCE EVERY EVENING     Cardiovascular:  Antilipid - Statins 2 Failed - 02/27/2023  8:03 AM      Failed - Lipid Panel in normal range within the last 12 months    Cholesterol, Total  Date Value Ref Range Status  11/23/2022 120 100 - 199 mg/dL Final   LDL Chol Calc (NIH)  Date Value Ref Range Status  11/23/2022 54 0 - 99 mg/dL Final   HDL  Date Value Ref Range Status  11/23/2022 37 (L) >39 mg/dL Final   Triglycerides  Date Value Ref Range Status  11/23/2022 170 (H) 0 - 149 mg/dL Final         Passed - Cr in normal range and within 360 days    Creatinine  Date Value Ref Range Status  01/25/2013 1.74 (H) 0.60 - 1.30 mg/dL Final   Creatinine, Ser  Date Value Ref Range Status  11/23/2022 1.21 0.76 - 1.27 mg/dL Final         Passed - Patient is not pregnant      Passed - Valid encounter within last 12 months    Recent Outpatient Visits           3 months ago Essential (primary) hypertension   [facility], [place], [place]   6 months ago Mixed hyperlipidemia   [facility] [place], [place], [place]   10 months ago Acute bilateral thoracic back pain   [facility] Bosie Clos, MD   1 year ago Encounter for Medicare annual wellness exam   [facility] Bosie Clos, MD   1 year ago Cellulitis of finger, unspecified laterality   [facility] [place], [place], PA-C       Future Appointments             In 4 months Stoioff, Verna Czech, MD [facility] [place]

## 2023-02-27 NOTE — Assessment & Plan Note (Signed)
Chronic  Stable with CPAP use  Patient advised to continue CPAP nightly  The patient has had significant benefit from the use of CPAP machine with considerable improvements in quality of life, work production and decrease medical complaints.  The patient will need continued maintenance and care CPAP supplies (including upgrades as deemed appropriate) in order to continue treatment for sleep apnea as this is medically necessary.     

## 2023-02-27 NOTE — Assessment & Plan Note (Signed)
Chronic Reports this was previously flat  Exam appears consistent with SK, discussed potential options for removal, patient prefers to wait for dermatology evaluation  Patient encouraged to follow up with derm as scheduled in the next 3-4 months for skin check

## 2023-02-27 NOTE — Assessment & Plan Note (Signed)
Chronic  Observed on exam Patient denies pain  Recommended monitoring for development of symptoms and following up if he would like to have referral to general surgery for repair

## 2023-02-27 NOTE — Progress Notes (Signed)
I,Sha'taria Tyson,acting as a Neurosurgeon for [employer], MD.,have documented all relevant documentation on the behalf of Ronnald Ramp, MD,as directed by  Ronnald Ramp, MD while in the presence of Ronnald Ramp, MD.   Annual Wellness Visit     Patient: Jose Lewis, Male    DOB: 05/29/1956, 67 y.o.   MRN: [id number] Visit Date: 02/27/2023  Today's Provider: Ronnald Ramp, MD   No chief complaint on file.  Subjective    Jose Lewis is a 67 y.o. male who presents today for his Annual Wellness Visit. He reports consuming a general diet. Home exercise routine includes yard work. He generally feels well. He reports sleeping between fairly well and poorly due to mask/machine or getting up to go the restroom. He does have additional problems to discuss today.   HPI -Patient reports having a question in regards to mole on his neck. States he is seen regularly at [facility] dermatology but it has went from being a flat mole to a skin tag and would like it to be looked at. Patient reports he noticed the change in structure in the last 2-3 months. Patient offered removal, prefers to wait until appt with derm  Reports having 2 doses of shingrix both at pharmacy, patient would like to submit vaccine records for review from the encounters   Patient requests to have uric acid levels drawn while on allopurinol   Medications: Outpatient Medications Prior to Visit  Medication Sig   allopurinol (ZYLOPRIM) 100 MG tablet TAKE 1 TABLET BY MOUTH TWICE DAILY   amLODipine-benazepril (LOTREL) 10-40 MG capsule Take 1 capsule by mouth daily.   cyanocobalamin 1000 MCG tablet Take 1,000 mcg by mouth daily.   cyclobenzaprine (FLEXERIL) 10 MG tablet 1 POQ 8 hrs prn muscle tightness   Loratadine (CLARITIN PO) Take by mouth.   meclizine (ANTIVERT) 25 MG tablet Take by mouth. Reported on 03/01/2016   meloxicam (MOBIC) 15 MG tablet meloxicam 15 mg  tablet  Take 1 tablet every day by oral route with meals.   omeprazole (PRILOSEC) 20 MG capsule Take 20 mg by mouth daily. Patient takes BRAND NAME PRILOSEC ONLY   Probiotic Product (PROBIOTIC-10 PO) Take by mouth daily.   rosuvastatin (CRESTOR) 40 MG tablet TAKE 1 TABLET BY MOUTH ONCE EVERY EVENING   sildenafil (REVATIO) 20 MG tablet TAKE THREE TO FIVE TABLETS BY MOUTH DAILY AS NEEDED.   Glucosamine-Chondroitin 250-200 MG TABS OSTEO BI-FLEX REGULAR STRENGTH, 250-200MG  (Oral Tablet)  1 Every Day for 0 days  Quantity: 0.00;  Refills: 0   Ordered :27-Dec-2010  Berta Minor ;  Started 29-Nov-2008 Active (Patient not taking: Reported on 02/27/2023)   No facility-administered medications prior to visit.    No Known Allergies  Patient Care Team: Ronnald Ramp, MD as PCP - General (Family Medicine) Kieth Brightly, MD (General Surgery) Janeann Forehand., MD (Inactive) (Family Medicine)  Review of Systems  HENT:  Positive for postnasal drip and sneezing.   Respiratory:  Positive for cough.   Gastrointestinal:  Positive for anal bleeding.  Musculoskeletal:  Positive for arthralgias.  Allergic/Immunologic: Positive for environmental allergies.        Objective    Vitals: BP 126/84 (BP Location: Left Arm, Patient Position: Sitting, Cuff Size: Large)   Pulse 67   Ht 5\' 11"  (1.803 m)   Wt 283 lb 8 oz (128.6 kg)   BMI 39.54 kg/m     Physical Exam Vitals reviewed.  Constitutional:  General: He is not in acute distress.    Appearance: Normal appearance. He is obese. He is not ill-appearing, toxic-appearing or diaphoretic.  HENT:     Head: Normocephalic and atraumatic.     Right Ear: Tympanic membrane and external ear normal.     Left Ear: Tympanic membrane and external ear normal.     Nose: Nose normal.     Mouth/Throat:     Mouth: Mucous membranes are moist.     Pharynx: No oropharyngeal exudate or posterior oropharyngeal erythema.  Eyes:      General: No scleral icterus.    Extraocular Movements: Extraocular movements intact.     Conjunctiva/sclera: Conjunctivae normal.     Pupils: Pupils are equal, round, and reactive to light.  Neck:     Vascular: No carotid bruit.  Cardiovascular:     Rate and Rhythm: Normal rate and regular rhythm.     Pulses: Normal pulses.     Heart sounds: Normal heart sounds. No murmur heard.    No friction rub. No gallop.  Pulmonary:     Effort: Pulmonary effort is normal. No respiratory distress.     Breath sounds: Normal breath sounds. No stridor. No wheezing, rhonchi or rales.  Abdominal:     General: Bowel sounds are normal. There is no distension.     Palpations: Abdomen is soft.     Tenderness: There is no abdominal tenderness.     Hernia: A hernia is present. Hernia is present in the ventral area.  Musculoskeletal:        General: No swelling, tenderness or signs of injury. Normal range of motion.     Cervical back: Normal range of motion and neck supple. No rigidity or tenderness.     Right lower leg: No edema.     Left lower leg: No edema.  Lymphadenopathy:     Cervical: No cervical adenopathy.  Skin:    General: Skin is warm and dry.     Capillary Refill: Capillary refill takes less than 2 seconds.     Findings: No erythema or rash.  Neurological:     General: No focal deficit present.     Mental Status: He is alert and oriented to person, place, and time.     Cranial Nerves: Cranial nerves 2-12 are intact.     Sensory: Sensation is intact.     Motor: Motor function is intact. No weakness, tremor or abnormal muscle tone.     Gait: Gait normal.  Psychiatric:        Attention and Perception: Attention normal.        Mood and Affect: Mood normal.        Speech: Speech normal.        Behavior: Behavior normal. Behavior is cooperative.        Thought Content: Thought content normal.      Most recent functional status assessment:    02/27/2023    3:37 PM  In your present state  of health, do you have any difficulty performing the following activities:  Hearing? 0  Vision? 0  Difficulty concentrating or making decisions? 0  Walking or climbing stairs? 1  Dressing or bathing? 0  Doing errands, shopping? 0   Most recent fall risk assessment:    02/27/2023    3:36 PM  Fall Risk   Falls in the past year? 0  Number falls in past yr: 0  Injury with Fall? 0  Risk for fall due to : No  Fall Risks  Follow up Falls evaluation completed    Most recent depression screenings:    02/27/2023    3:36 PM 11/28/2022    3:08 PM  PHQ 2/9 Scores  PHQ - 2 Score 0 0  PHQ- 9 Score 3    Most recent cognitive screening:     No data to display         Most recent Audit-C alcohol use screening    02/27/2023    3:36 PM  Alcohol Use Disorder Test (AUDIT)  1. How often do you have a drink containing alcohol? 0  3. How often do you have six or more drinks on one occasion? 0   A score of 3 or more in women, and 4 or more in men indicates increased risk for alcohol abuse, EXCEPT if all of the points are from question 1   No results found for any visits on 02/27/23.  Assessment & Plan     Annual wellness visit done today including the all of the following: Reviewed patient's Family Medical History Reviewed and updated list of patient's medical providers Assessment of cognitive impairment was done Assessed patient's functional ability Established a written schedule for health screening services Health Risk Assessent Completed and Reviewed  Exercise Activities and Dietary recommendations  Goals   None     Immunization History  Administered Date(s) Administered   Fluad Quad(high Dose 65+) 07/20/2020, 06/21/2021, 08/24/2022   Influenza Split 08/26/2004   Influenza,inj,Quad PF,6+ Mos 07/11/2013, 07/03/2014, 09/02/2015, 07/20/2016, 07/27/2017, 07/26/2018   Moderna Covid-19 Vaccine Bivalent Booster 72yrs & up 02/02/2021   PFIZER(Purple Top)SARS-COV-2 Vaccination  11/13/2019, 12/04/2019, 06/23/2020   PNEUMOCOCCAL CONJUGATE-20 02/14/2022   Pfizer Covid-19 Vaccine Bivalent Booster 74yrs & up 06/05/2021   Pneumococcal Polysaccharide-23 01/21/2020   Td 09/11/2003, 01/03/2022   Tdap 01/07/2013   Zoster Recombinat (Shingrix) 10/26/2022   Zoster, Live 02/09/2015    Health Maintenance  Topic Date Due   COVID-19 Vaccine (6 - 2023-24 season) 05/25/2022   Zoster Vaccines- Shingrix (2 of 2) 12/21/2022   INFLUENZA VACCINE  04/25/2023   Medicare Annual Wellness (AWV)  02/27/2024   Colonoscopy  04/08/2028   DTaP/Tdap/Td (4 - Td or Tdap) 01/04/2032   Pneumonia Vaccine 21+ Years old  Completed   Hepatitis C Screening  Completed   HPV VACCINES  Aged Out    Problem List Items Addressed This Visit       Cardiovascular and Mediastinum   HTN (hypertension) (Chronic)    Chronic  Stable  BP well controlled today  No medication changes  CMP collected today       Relevant Orders   HgB A1c     Respiratory   OSA (obstructive sleep apnea) (Chronic)    Chronic  Stable with CPAP use  Patient advised to continue CPAP nightly  The patient has had significant benefit from the use of CPAP machine with considerable improvements in quality of life, work production and decrease medical complaints.  The patient will need continued maintenance and care CPAP supplies (including upgrades as deemed appropriate) in order to continue treatment for sleep apnea as this is medically necessary.           Musculoskeletal and Integument   ST (skin tag)    Chronic Reports this was previously flat  Exam appears consistent with SK, discussed potential options for removal, patient prefers to wait for dermatology evaluation  Patient encouraged to follow up with derm as scheduled in the next 3-4 months for skin check  Other   Obesity, unspecified (Chronic)    Chronic  BMI 39  Will check A1c, CMP, CBC, lipid panel       HLD (hyperlipidemia)    Chronic  Will  continue crestor 40mg   Reports some myalgias so held statin for 2 weeks and recently restarted it without much difference in symptoms  We will check cmp to assess AST and ALT  Ordered lipid panel  Patient plans to return for labs while fasting       Relevant Orders   HgB A1c   Avitaminosis D    Chronic  Stable  Recommended checking vitamin D levels with labs  Vitamin D ordered        Relevant Orders   Vitamin B12   Vitamin D (25 hydroxy)   Prediabetes    Repeat a1c      Encounter for annual wellness visit (AWV) in Medicare patient - Primary    Annual wellness visit completed today including all of the following: -Reviewed patient's family medical history -Reviewed and updated patient's list of medical providers -Completed assessment of cognitive impairment -Completed assessment of patient's functional ability -Provided patient with recommendations for health screening services as well as vaccines Health risk assessment completed and reviewed -Discussed recommendations for well-balanced diet in addition to 150 minutes of physical activity per week       Annual physical exam    Chronic conditions are stable  Patient was counseled on benefits of regular physical activity with goal of 150 minutes of moderate to vigurous intensity 4 days per week, recommend activity as tolerated due to mulitple joint issues involving lower extremities  Patient was counseled to consume well balanced diet of fruits, vegetables, limited saturated fats and limited sugary foods and beverages with emphasis on consuming 6-8 glasses of water daily  Screening recommended today: hep c screening ordered  Vaccines recommended today: none, will need to review shingrix vaccine records from the pharmacy as patient charts indicates only one dose of Shingrix  Patient is UTD on age appropriate screenings       Ventral hernia without obstruction or gangrene    Chronic  Observed on exam Patient denies pain   Recommended monitoring for development of symptoms and following up if he would like to have referral to general surgery for repair        B12 deficiency    Chronic  Repeat vitamin B12 levels       Relevant Orders   Vitamin B12   Other Visit Diagnoses     Obesity (BMI 30-39.9)       Relevant Orders   Lipid Profile   Comprehensive metabolic panel   CBC with Differential/Platelet   HgB A1c   Vitamin B12   Encounter for hepatitis C screening test for low risk patient       Relevant Orders   Hepatitis C Antibody   Gout, unspecified cause, unspecified chronicity, unspecified site       Relevant Orders   Uric acid        Return in about 4 months (around 06/29/2023) for chronic f/u .      The entirety of the information documented in the History of Present Illness, Review of Systems and Physical Exam were personally obtained by me. Portions of this information were initially documented by Acey Lav . I, Ronnald Ramp, MD have reviewed the documentation above for thoroughness and accuracy.      Ronnald Ramp, MD  [facility] [phone] (phone)  [phone] (fax)  [facility]

## 2023-02-27 NOTE — Assessment & Plan Note (Signed)
Repeat a1c 

## 2023-02-27 NOTE — Assessment & Plan Note (Addendum)
Chronic  Will continue crestor 40mg   Reports some myalgias so held statin for 2 weeks and recently restarted it without much difference in symptoms  We will check cmp to assess AST and ALT  Ordered lipid panel  Patient plans to return for labs while fasting

## 2023-02-27 NOTE — Assessment & Plan Note (Signed)
Annual wellness visit completed today including all of the following: -Reviewed patient's family medical history -Reviewed and updated patient's list of medical providers -Completed assessment of cognitive impairment -Completed assessment of patient's functional ability -Provided patient with recommendations for health screening services as well as vaccines Health risk assessment completed and reviewed -Discussed recommendations for well-balanced diet in addition to 150 minutes of physical activity per week  

## 2023-02-27 NOTE — Assessment & Plan Note (Signed)
Chronic conditions are stable  Patient was counseled on benefits of regular physical activity with goal of 150 minutes of moderate to vigurous intensity 4 days per week, recommend activity as tolerated due to mulitple joint issues involving lower extremities  Patient was counseled to consume well balanced diet of fruits, vegetables, limited saturated fats and limited sugary foods and beverages with emphasis on consuming 6-8 glasses of water daily  Screening recommended today: hep c screening ordered  Vaccines recommended today: none, will need to review shingrix vaccine records from the pharmacy as patient charts indicates only one dose of Shingrix  Patient is UTD on age appropriate screenings

## 2023-02-27 NOTE — Assessment & Plan Note (Signed)
Chronic  Repeat vitamin B12 levels

## 2023-02-28 DIAGNOSIS — E669 Obesity, unspecified: Secondary | ICD-10-CM | POA: Diagnosis not present

## 2023-02-28 DIAGNOSIS — Z1159 Encounter for screening for other viral diseases: Secondary | ICD-10-CM | POA: Diagnosis not present

## 2023-02-28 DIAGNOSIS — E559 Vitamin D deficiency, unspecified: Secondary | ICD-10-CM | POA: Diagnosis not present

## 2023-02-28 DIAGNOSIS — M109 Gout, unspecified: Secondary | ICD-10-CM | POA: Diagnosis not present

## 2023-02-28 DIAGNOSIS — E782 Mixed hyperlipidemia: Secondary | ICD-10-CM | POA: Diagnosis not present

## 2023-02-28 DIAGNOSIS — E538 Deficiency of other specified B group vitamins: Secondary | ICD-10-CM | POA: Diagnosis not present

## 2023-02-28 DIAGNOSIS — I10 Essential (primary) hypertension: Secondary | ICD-10-CM | POA: Diagnosis not present

## 2023-03-08 NOTE — Addendum Note (Signed)
Addended by: Bing Neighbors on: 03/08/2023 08:20 AM   Modules accepted: Level of Service

## 2023-03-13 LAB — VITAMIN D 25 HYDROXY (VIT D DEFICIENCY, FRACTURES): Vit D, 25-Hydroxy: 41.3 ng/mL (ref 30.0–100.0)

## 2023-03-16 LAB — COMPREHENSIVE METABOLIC PANEL
ALT: 20 IU/L (ref 0–44)
AST: 17 IU/L (ref 0–40)
Albumin/Globulin Ratio: 1.8 (ref 1.2–2.2)
Albumin: 4.4 g/dL (ref 3.9–4.9)
Alkaline Phosphatase: 119 IU/L (ref 44–121)
BUN/Creatinine Ratio: 15 (ref 10–24)
BUN: 20 mg/dL (ref 8–27)
Bilirubin Total: 0.6 mg/dL (ref 0.0–1.2)
CO2: 21 mmol/L (ref 20–29)
Calcium: 9.6 mg/dL (ref 8.6–10.2)
Chloride: 104 mmol/L (ref 96–106)
Creatinine, Ser: 1.32 mg/dL — ABNORMAL HIGH (ref 0.76–1.27)
Globulin, Total: 2.4 g/dL (ref 1.5–4.5)
Glucose: 113 mg/dL — ABNORMAL HIGH (ref 70–99)
Potassium: 4.6 mmol/L (ref 3.5–5.2)
Sodium: 139 mmol/L (ref 134–144)
Total Protein: 6.8 g/dL (ref 6.0–8.5)
eGFR: 59 mL/min/{1.73_m2} — ABNORMAL LOW (ref >59)

## 2023-03-16 LAB — CBC WITH DIFFERENTIAL/PLATELET
Basophils Absolute: 0 10*3/uL (ref 0.0–0.2)
Basos: 1 %
EOS (ABSOLUTE): 0.1 10*3/uL (ref 0.0–0.4)
Eos: 2 %
Hematocrit: 40.7 % (ref 37.5–51.0)
Hemoglobin: 14 g/dL (ref 13.0–17.7)
Immature Grans (Abs): 0 10*3/uL (ref 0.0–0.1)
Immature Granulocytes: 0 %
Lymphocytes Absolute: 1.5 10*3/uL (ref 0.7–3.1)
Lymphs: 22 %
MCH: 30.6 pg (ref 26.6–33.0)
MCHC: 34.4 g/dL (ref 31.5–35.7)
MCV: 89 fL (ref 79–97)
Monocytes Absolute: 0.4 10*3/uL (ref 0.1–0.9)
Monocytes: 6 %
Neutrophils Absolute: 4.6 10*3/uL (ref 1.4–7.0)
Neutrophils: 69 %
Platelets: 196 10*3/uL (ref 150–450)
RBC: 4.57 x10E6/uL (ref 4.14–5.80)
RDW: 13.4 % (ref 11.6–15.4)
WBC: 6.6 10*3/uL (ref 3.4–10.8)

## 2023-03-16 LAB — HEPATITIS C ANTIBODY

## 2023-03-16 LAB — LIPID PANEL
Chol/HDL Ratio: 2.9 ratio (ref 0.0–5.0)
Cholesterol, Total: 118 mg/dL (ref 100–199)
HDL: 41 mg/dL (ref >39)
LDL Chol Calc (NIH): 57 mg/dL (ref 0–99)
Triglycerides: 107 mg/dL (ref 0–149)
VLDL Cholesterol Cal: 20 mg/dL (ref 5–40)

## 2023-03-16 LAB — HEMOGLOBIN A1C
Est. average glucose Bld gHb Est-mCnc: 128 mg/dL
Hgb A1c MFr Bld: 6.1 % — ABNORMAL HIGH (ref 4.8–5.6)

## 2023-03-16 LAB — URIC ACID: Uric Acid: 6.1 mg/dL (ref 3.8–8.4)

## 2023-03-16 LAB — VITAMIN B12: Vitamin B-12: 945 pg/mL (ref 232–1245)

## 2023-03-21 ENCOUNTER — Other Ambulatory Visit: Payer: Self-pay | Admitting: Family Medicine

## 2023-03-21 NOTE — Telephone Encounter (Signed)
Medication Refill - Medication: allopurinol (ZYLOPRIM) 100 MG tablet   Has the patient contacted their pharmacy? yes (Agent: If no, request that the patient contact the pharmacy for the refill. If patient does not wish to contact the pharmacy document the reason why and proceed with request.) (Agent: If yes, when and what did the pharmacy advise?)contact pcp  Preferred Pharmacy (with phone number or street name):  [id number] - [place], [place] - [address] Phone: [phone]  Fax: [phone]     Has the patient been seen for an appointment in the last year OR does the patient have an upcoming appointment? yes  Agent: Please be advised that RX refills may take up to 3 business days. We ask that you follow-up with your pharmacy.

## 2023-03-22 MED ORDER — ALLOPURINOL 100 MG PO TABS: 100.0000 mg | ORAL_TABLET | Freq: Two times a day (BID) | ORAL | 0 refills | Status: DC

## 2023-03-22 NOTE — Telephone Encounter (Signed)
Requested Prescriptions  Pending Prescriptions Disp Refills   allopurinol (ZYLOPRIM) 100 MG tablet 180 tablet 0    Sig: Take 1 tablet (100 mg total) by mouth 2 (two) times daily.     Endocrinology:  Gout Agents - allopurinol Failed - 03/21/2023 10:53 AM      Failed - Cr in normal range and within 360 days    Creatinine  Date Value Ref Range Status  01/25/2013 1.74 (H) 0.60 - 1.30 mg/dL Final   Creatinine, Ser  Date Value Ref Range Status  02/28/2023 1.32 (H) 0.76 - 1.27 mg/dL Final         Passed - Uric Acid in normal range and within 360 days    Uric Acid  Date Value Ref Range Status  02/28/2023 6.1 3.8 - 8.4 mg/dL Final    Comment:               Therapeutic target for gout patients: <6.0         Passed - Valid encounter within last 12 months    Recent Outpatient Visits           3 weeks ago Encounter for annual wellness visit (AWV) in Medicare patient   [place] [facility] Simmons-Robinson, [place], MD   3 months ago Essential (primary) hypertension   [place] [facility] Simmons-Robinson, [place], MD   7 months ago Mixed hyperlipidemia   [place] [facility] [place], [place], MD   11 months ago Acute bilateral thoracic back pain   [place] [facility] Bosie Clos, MD   1 year ago Encounter for Medicare annual wellness exam   [facility] Bosie Clos, MD       Future Appointments             In 3 months Stoioff, Verna Czech, MD [facility] [place]            Passed - CBC within normal limits and completed in the last 12 months    WBC  Date Value Ref Range Status  02/28/2023 6.6 3.4 - 10.8 x10E3/uL Final  01/26/2013 12.5 (H) 4.0 - 10.5 K/uL Final   RBC  Date Value Ref Range Status  02/28/2023 4.57 4.14 - 5.80 x10E6/uL Final  01/26/2013 4.43 4.22 - 5.81 MIL/uL Final   Hemoglobin  Date Value Ref Range Status  02/28/2023 14.0  13.0 - 17.7 g/dL Final   Hematocrit  Date Value Ref Range Status  02/28/2023 40.7 37.5 - 51.0 % Final   MCHC  Date Value Ref Range Status  02/28/2023 34.4 31.5 - 35.7 g/dL Final  81/19/1478 29.5 30.0 - 36.0 g/dL Final   [facility]  Date Value Ref Range Status  02/28/2023 30.6 26.6 - 33.0 pg Final  01/26/2013 30.5 26.0 - 34.0 pg Final   MCV  Date Value Ref Range Status  02/28/2023 89 79 - 97 fL Final  01/25/2013 90 80 - 100 fL Final   No results found for: "PLTCOUNTKUC", "LABPLAT", "POCPLA" RDW  Date Value Ref Range Status  02/28/2023 13.4 11.6 - 15.4 % Final  01/25/2013 13.7 11.5 - 14.5 % Final

## 2023-05-17 DIAGNOSIS — B360 Pityriasis versicolor: Secondary | ICD-10-CM | POA: Diagnosis not present

## 2023-05-17 DIAGNOSIS — D235 Other benign neoplasm of skin of trunk: Secondary | ICD-10-CM | POA: Diagnosis not present

## 2023-05-17 DIAGNOSIS — D2339 Other benign neoplasm of skin of other parts of face: Secondary | ICD-10-CM | POA: Diagnosis not present

## 2023-05-17 DIAGNOSIS — L918 Other hypertrophic disorders of the skin: Secondary | ICD-10-CM | POA: Diagnosis not present

## 2023-05-17 DIAGNOSIS — L538 Other specified erythematous conditions: Secondary | ICD-10-CM | POA: Diagnosis not present

## 2023-05-17 DIAGNOSIS — L821 Other seborrheic keratosis: Secondary | ICD-10-CM | POA: Diagnosis not present

## 2023-05-17 DIAGNOSIS — R208 Other disturbances of skin sensation: Secondary | ICD-10-CM | POA: Diagnosis not present

## 2023-05-17 DIAGNOSIS — L853 Xerosis cutis: Secondary | ICD-10-CM | POA: Diagnosis not present

## 2023-05-21 DIAGNOSIS — H353131 Nonexudative age-related macular degeneration, bilateral, early dry stage: Secondary | ICD-10-CM | POA: Diagnosis not present

## 2023-05-21 DIAGNOSIS — H04123 Dry eye syndrome of bilateral lacrimal glands: Secondary | ICD-10-CM | POA: Diagnosis not present

## 2023-05-21 DIAGNOSIS — H2513 Age-related nuclear cataract, bilateral: Secondary | ICD-10-CM | POA: Diagnosis not present

## 2023-05-21 DIAGNOSIS — Z9889 Other specified postprocedural states: Secondary | ICD-10-CM | POA: Diagnosis not present

## 2023-06-18 ENCOUNTER — Other Ambulatory Visit: Payer: Self-pay | Admitting: Family Medicine

## 2023-07-02 ENCOUNTER — Other Ambulatory Visit: Payer: Self-pay | Admitting: *Deleted

## 2023-07-02 DIAGNOSIS — Z8546 Personal history of malignant neoplasm of prostate: Secondary | ICD-10-CM

## 2023-07-05 ENCOUNTER — Other Ambulatory Visit: Payer: Medicare HMO

## 2023-07-05 DIAGNOSIS — Z8546 Personal history of malignant neoplasm of prostate: Secondary | ICD-10-CM | POA: Diagnosis not present

## 2023-07-06 LAB — PSA: Prostate Specific Ag, Serum: <0.1 ng/mL (ref 0.0–4.0)

## 2023-07-18 ENCOUNTER — Other Ambulatory Visit: Payer: Self-pay | Admitting: *Deleted

## 2023-07-18 DIAGNOSIS — N200 Calculus of kidney: Secondary | ICD-10-CM

## 2023-07-19 ENCOUNTER — Encounter: Payer: Self-pay | Admitting: Urology

## 2023-07-19 ENCOUNTER — Ambulatory Visit: Payer: Medicare HMO | Admitting: Urology

## 2023-07-19 ENCOUNTER — Ambulatory Visit
Admission: RE | Admit: 2023-07-19 | Discharge: 2023-07-19 | Disposition: A | Discharge status: Home / Self Care | Payer: Medicare HMO | Attending: Urology | Admitting: Urology

## 2023-07-19 ENCOUNTER — Ambulatory Visit
Admission: RE | Admit: 2023-07-19 | Discharge: 2023-07-19 | Disposition: A | Discharge status: Home / Self Care | Payer: Medicare HMO | Source: Ambulatory Visit | Attending: Urology | Admitting: Urology

## 2023-07-19 VITALS — BP 149/80 | HR 82 | Ht 72.0 in | Wt 280.0 lb

## 2023-07-19 DIAGNOSIS — Z8546 Personal history of malignant neoplasm of prostate: Secondary | ICD-10-CM

## 2023-07-19 DIAGNOSIS — N200 Calculus of kidney: Secondary | ICD-10-CM
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
## 2023-09-01 IMAGING — CR DG ABDOMEN 1V
1 series · 2 of 2 positions shown · non-contrast
Comparison: 12/16/2020

CLINICAL DATA: Follow-up kidney stones

EXAM:
ABDOMEN - 1 VIEW

[Series 1: dg abd 1 view · 0.14mm/px · 2 of 2 slices shown]
[im 1/2]
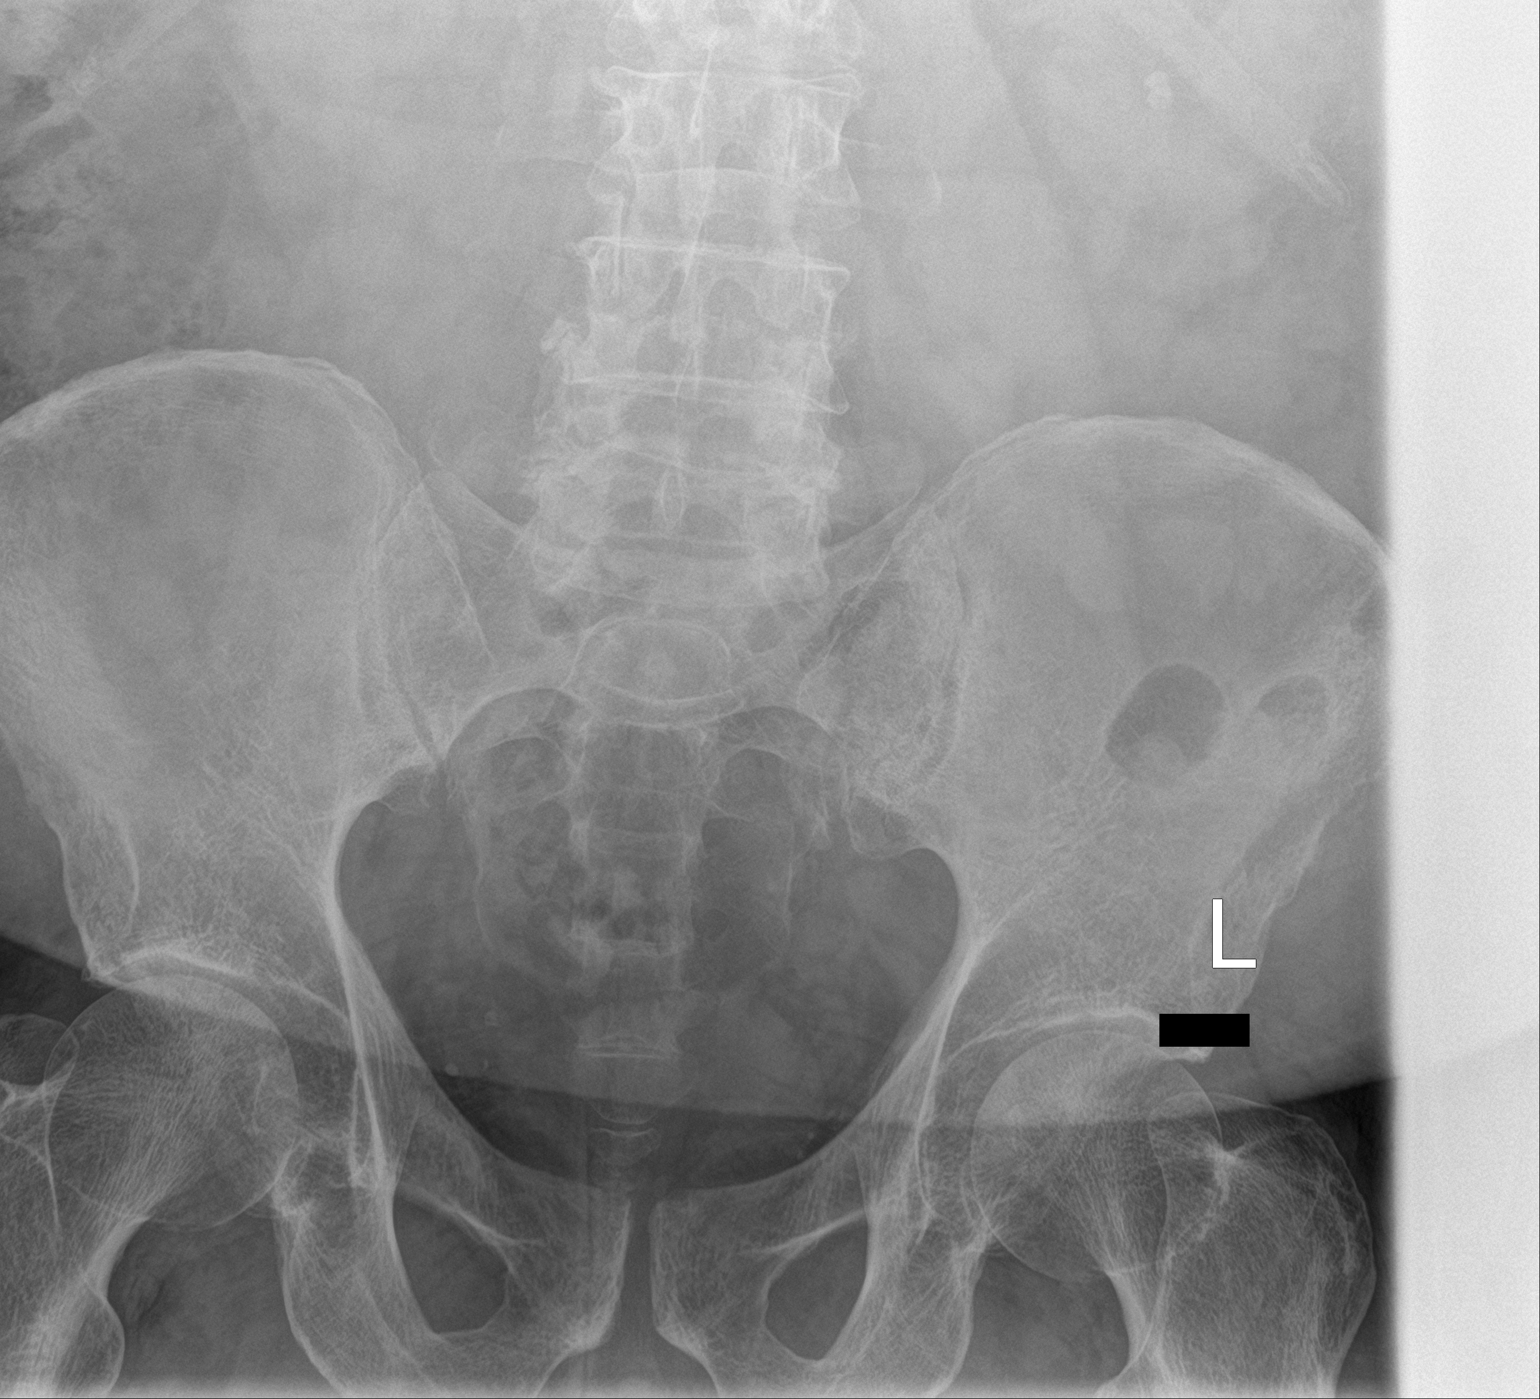
[im 2/2]
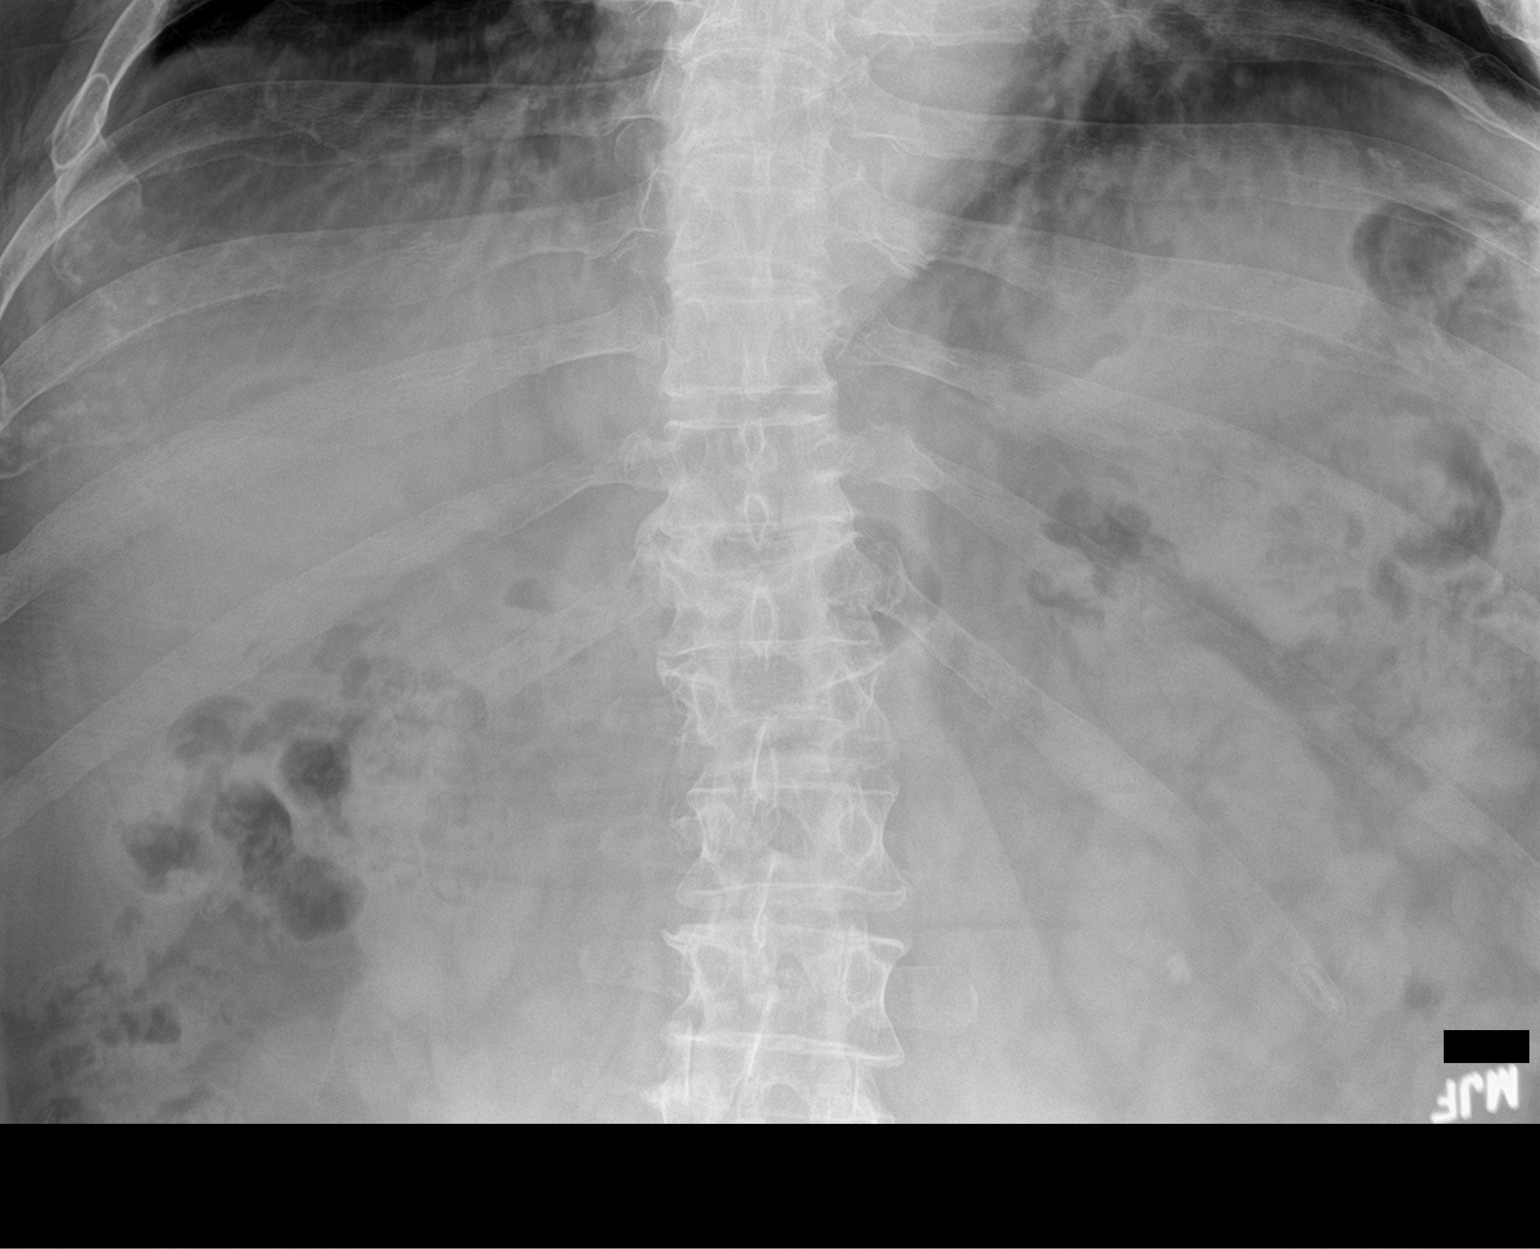

[2 of 2 positions shown; findings below may reference images not displayed]

FINDINGS: 7 mm calculus at inferior pole LEFT kidney unchanged.

No additional urinary tract calcifications.

Nonobstructive bowel gas pattern.

Diffuse osseous demineralization with facet degenerative changes and
scoliosis of lumbar spine with superior endplate compression
fractures of T12 and L1, suboptimally assessed on AP imaging.
IMPRESSION: Unchanged 7 mm LEFT inferior pole renal calculus.

Superior endplate compression fractures of T12 and L1, unchanged.

## 2023-09-04 ENCOUNTER — Ambulatory Visit (INDEPENDENT_AMBULATORY_CARE_PROVIDER_SITE_OTHER): Payer: Medicare HMO

## 2023-09-04 DIAGNOSIS — Z23 Encounter for immunization: Secondary | ICD-10-CM | POA: Diagnosis not present

## 2023-09-04 NOTE — Progress Notes (Signed)
Patient tolerated influenza vaccine well.

## 2023-09-16 ENCOUNTER — Other Ambulatory Visit: Payer: Self-pay | Admitting: Family Medicine

## 2023-09-23 ENCOUNTER — Other Ambulatory Visit: Payer: Self-pay | Admitting: Family Medicine

## 2023-10-14 DIAGNOSIS — X58XXXA Exposure to other specified factors, initial encounter: Secondary | ICD-10-CM | POA: Diagnosis not present

## 2023-10-14 DIAGNOSIS — K819 Cholecystitis, unspecified: Secondary | ICD-10-CM | POA: Diagnosis not present

## 2023-10-14 DIAGNOSIS — N5089 Other specified disorders of the male genital organs: Secondary | ICD-10-CM | POA: Diagnosis not present

## 2023-10-14 DIAGNOSIS — R202 Paresthesia of skin: Secondary | ICD-10-CM | POA: Diagnosis not present

## 2023-10-14 DIAGNOSIS — Z1152 Encounter for screening for COVID-19: Secondary | ICD-10-CM | POA: Diagnosis not present

## 2023-10-14 DIAGNOSIS — I872 Venous insufficiency (chronic) (peripheral): Secondary | ICD-10-CM | POA: Diagnosis not present

## 2023-10-14 DIAGNOSIS — K82A1 Gangrene of gallbladder in cholecystitis: Secondary | ICD-10-CM | POA: Diagnosis not present

## 2023-10-14 DIAGNOSIS — R7989 Other specified abnormal findings of blood chemistry: Secondary | ICD-10-CM | POA: Diagnosis not present

## 2023-10-14 DIAGNOSIS — M109 Gout, unspecified: Secondary | ICD-10-CM | POA: Diagnosis not present

## 2023-10-14 DIAGNOSIS — K219 Gastro-esophageal reflux disease without esophagitis: Secondary | ICD-10-CM | POA: Diagnosis not present

## 2023-10-14 DIAGNOSIS — R0602 Shortness of breath: Secondary | ICD-10-CM | POA: Diagnosis not present

## 2023-10-14 DIAGNOSIS — K429 Umbilical hernia without obstruction or gangrene: Secondary | ICD-10-CM | POA: Diagnosis not present

## 2023-10-14 DIAGNOSIS — I4510 Unspecified right bundle-branch block: Secondary | ICD-10-CM | POA: Diagnosis not present

## 2023-10-14 DIAGNOSIS — Z9889 Other specified postprocedural states: Secondary | ICD-10-CM | POA: Diagnosis not present

## 2023-10-14 DIAGNOSIS — E785 Hyperlipidemia, unspecified: Secondary | ICD-10-CM | POA: Diagnosis not present

## 2023-10-14 DIAGNOSIS — E66812 Obesity, class 2: Secondary | ICD-10-CM | POA: Diagnosis not present

## 2023-10-14 DIAGNOSIS — J309 Allergic rhinitis, unspecified: Secondary | ICD-10-CM | POA: Diagnosis not present

## 2023-10-14 DIAGNOSIS — R361 Hematospermia: Secondary | ICD-10-CM | POA: Diagnosis not present

## 2023-10-14 DIAGNOSIS — K8020 Calculus of gallbladder without cholecystitis without obstruction: Secondary | ICD-10-CM | POA: Diagnosis not present

## 2023-10-14 DIAGNOSIS — R932 Abnormal findings on diagnostic imaging of liver and biliary tract: Secondary | ICD-10-CM | POA: Diagnosis not present

## 2023-10-14 DIAGNOSIS — K810 Acute cholecystitis: Secondary | ICD-10-CM | POA: Diagnosis not present

## 2023-10-14 DIAGNOSIS — E538 Deficiency of other specified B group vitamins: Secondary | ICD-10-CM | POA: Diagnosis not present

## 2023-10-14 DIAGNOSIS — K8000 Calculus of gallbladder with acute cholecystitis without obstruction: Secondary | ICD-10-CM | POA: Diagnosis not present

## 2023-10-14 DIAGNOSIS — K4090 Unilateral inguinal hernia, without obstruction or gangrene, not specified as recurrent: Secondary | ICD-10-CM | POA: Diagnosis not present

## 2023-10-14 DIAGNOSIS — R079 Chest pain, unspecified: Secondary | ICD-10-CM | POA: Diagnosis not present

## 2023-10-14 DIAGNOSIS — J9811 Atelectasis: Secondary | ICD-10-CM | POA: Diagnosis not present

## 2023-10-14 DIAGNOSIS — R531 Weakness: Secondary | ICD-10-CM | POA: Diagnosis not present

## 2023-10-14 DIAGNOSIS — Z791 Long term (current) use of non-steroidal anti-inflammatories (NSAID): Secondary | ICD-10-CM | POA: Diagnosis not present

## 2023-10-14 DIAGNOSIS — I10 Essential (primary) hypertension: Secondary | ICD-10-CM | POA: Diagnosis not present

## 2023-10-14 DIAGNOSIS — K760 Fatty (change of) liver, not elsewhere classified: Secondary | ICD-10-CM | POA: Diagnosis not present

## 2023-10-14 DIAGNOSIS — R7303 Prediabetes: Secondary | ICD-10-CM | POA: Diagnosis not present

## 2023-10-14 DIAGNOSIS — Z6841 Body Mass Index (BMI) 40.0 and over, adult: Secondary | ICD-10-CM | POA: Diagnosis not present

## 2023-10-14 DIAGNOSIS — R7401 Elevation of levels of liver transaminase levels: Secondary | ICD-10-CM | POA: Diagnosis not present

## 2023-10-14 DIAGNOSIS — N200 Calculus of kidney: Secondary | ICD-10-CM | POA: Diagnosis not present

## 2023-10-14 DIAGNOSIS — G4733 Obstructive sleep apnea (adult) (pediatric): Secondary | ICD-10-CM | POA: Diagnosis not present

## 2023-10-14 DIAGNOSIS — J9601 Acute respiratory failure with hypoxia: Secondary | ICD-10-CM | POA: Diagnosis not present

## 2023-10-14 DIAGNOSIS — C61 Malignant neoplasm of prostate: Secondary | ICD-10-CM | POA: Diagnosis not present

## 2023-10-15 DIAGNOSIS — R531 Weakness: Secondary | ICD-10-CM | POA: Diagnosis not present

## 2023-10-15 DIAGNOSIS — K760 Fatty (change of) liver, not elsewhere classified: Secondary | ICD-10-CM | POA: Diagnosis not present

## 2023-10-15 DIAGNOSIS — R932 Abnormal findings on diagnostic imaging of liver and biliary tract: Secondary | ICD-10-CM | POA: Diagnosis not present

## 2023-10-15 DIAGNOSIS — K429 Umbilical hernia without obstruction or gangrene: Secondary | ICD-10-CM | POA: Diagnosis not present

## 2023-10-15 DIAGNOSIS — K82A1 Gangrene of gallbladder in cholecystitis: Secondary | ICD-10-CM | POA: Diagnosis not present

## 2023-10-15 DIAGNOSIS — K819 Cholecystitis, unspecified: Secondary | ICD-10-CM | POA: Diagnosis not present

## 2023-10-15 DIAGNOSIS — R7989 Other specified abnormal findings of blood chemistry: Secondary | ICD-10-CM | POA: Insufficient documentation

## 2023-10-15 DIAGNOSIS — R202 Paresthesia of skin: Secondary | ICD-10-CM | POA: Diagnosis not present

## 2023-10-15 DIAGNOSIS — I10 Essential (primary) hypertension: Secondary | ICD-10-CM | POA: Diagnosis not present

## 2023-10-15 DIAGNOSIS — R079 Chest pain, unspecified: Secondary | ICD-10-CM | POA: Diagnosis not present

## 2023-10-15 DIAGNOSIS — J9811 Atelectasis: Secondary | ICD-10-CM | POA: Diagnosis not present

## 2023-10-15 DIAGNOSIS — R7401 Elevation of levels of liver transaminase levels: Secondary | ICD-10-CM | POA: Diagnosis not present

## 2023-10-15 DIAGNOSIS — K8020 Calculus of gallbladder without cholecystitis without obstruction: Secondary | ICD-10-CM | POA: Diagnosis not present

## 2023-10-15 DIAGNOSIS — K810 Acute cholecystitis: Secondary | ICD-10-CM | POA: Diagnosis not present

## 2023-10-15 DIAGNOSIS — K8000 Calculus of gallbladder with acute cholecystitis without obstruction: Secondary | ICD-10-CM | POA: Diagnosis not present

## 2023-10-15 DIAGNOSIS — K4090 Unilateral inguinal hernia, without obstruction or gangrene, not specified as recurrent: Secondary | ICD-10-CM | POA: Diagnosis not present

## 2023-10-15 DIAGNOSIS — Z9889 Other specified postprocedural states: Secondary | ICD-10-CM | POA: Diagnosis not present

## 2023-10-16 DIAGNOSIS — K810 Acute cholecystitis: Secondary | ICD-10-CM | POA: Diagnosis not present

## 2023-10-17 DIAGNOSIS — R0602 Shortness of breath: Secondary | ICD-10-CM | POA: Diagnosis not present

## 2023-10-21 ENCOUNTER — Telehealth: Payer: Self-pay

## 2023-10-21 NOTE — Telephone Encounter (Signed)
Received fax with MRI results:   Impression includes cystic lesions in pancreatic headand pancreatic tail measuring up on 0.6 possbily representing side branch IPMNs. Recommending imaging follow up.   <1.5cm and age 69-79- re-image  every 2 yearsx5 and stop if stable over 10 years   Will scan results into chart.    Is return call needed?

## 2023-10-21 NOTE — Telephone Encounter (Signed)
Copied from CRM [id number]. Topic: General - Inquiry >> Oct 18, 2023  1:30 PM Teressa P wrote: Reason for CRM: Jose Lewis. She is faxing MRI results for pancreatis that has a cyst.  There are fu instruction and telephone   [phone] option 3

## 2023-10-23 ENCOUNTER — Ambulatory Visit (INDEPENDENT_AMBULATORY_CARE_PROVIDER_SITE_OTHER): Payer: Medicare HMO | Admitting: Family Medicine

## 2023-10-23 ENCOUNTER — Encounter: Payer: Self-pay | Admitting: Family Medicine

## 2023-10-23 VITALS — BP 113/82 | HR 80 | Temp 98.0°F | Ht 71.0 in | Wt 283.3 lb

## 2023-10-23 DIAGNOSIS — G4733 Obstructive sleep apnea (adult) (pediatric): Secondary | ICD-10-CM | POA: Diagnosis not present

## 2023-10-23 DIAGNOSIS — K862 Cyst of pancreas: Secondary | ICD-10-CM | POA: Insufficient documentation

## 2023-10-23 DIAGNOSIS — Z9049 Acquired absence of other specified parts of digestive tract: Secondary | ICD-10-CM | POA: Insufficient documentation

## 2023-10-23 NOTE — Telephone Encounter (Signed)
Pt reports he was advised and has a few questions but that is what scheduled appt is for this afternoon with Caryl Asp so he will ask then if not already answered during visit.   FYI for both providers

## 2023-10-23 NOTE — Assessment & Plan Note (Addendum)
Laparoscopic surgery for gall bladder removal on 10/15/23,discharged from hospital on 10/18/23 Four lap sites present on abdomen - healing well, no signs of infection, pt afebrile Denies any concerns today, feels much better since procedure Pt's labs were trending down and normalizing at discharge, has gen surgery appt on 10/28/23 for f/u Referral placed for GI given recent procedure and incidental finding of pancreatic cyst on MRI.

## 2023-10-23 NOTE — Assessment & Plan Note (Signed)
Chronic, Stable with CPAP use  Did have some difficulty post anesthesia with oxygen use needs, but since resolved Breath sounds clear bilaterally during visit, no SOB, DOB. Sp02 = 97% Patient advised to continue CPAP nightly

## 2023-10-23 NOTE — Progress Notes (Signed)
Established Patient Office Visit  Introduced to nurse practitioner role and practice setting.  All questions answered.  Discussed provider/patient relationship and expectations.   Subjective   Patient ID: Jose Lewis, male    DOB: Jan 04, 1956  Age: 68 y.o. MRN: [id number]  Chief Complaint  Patient presents with   digestive concern    Called about abdominal pain and had gallbladder out Tuesday 10/15/2023    He is a 68 year old male who presents for follow-up post gallbladder removal via laparoscopy on 10/15/23 d/t cholecystitis.  Pt states was experiencing back pain on the right side for the past five to six months, on 10/13/23, prior to the surgery, he had decreased appetite and abdominal pain, which began suddenly after eating and progressed to fever, prompting an ER visit. States underwent gallbladder removal surgery eight days ago due to high bilirubin levels and numerous gallstones, some of which were large. States surgeon told him gallbladder was gangrenous and inflamed. Post-surgery, an MRI was performed to assess the bile duct, which showed a potential pancreatic cyst was noted, requiring monitoring. Since surgery, bilirubin levels have decreased - has f/u with gen surgery on 10/28/23. He reports normal bowel movements post-surgery and pain much better post-op.  States hard time coming off oxygen after surgery for the first day, but since has been good. Brought at home pulse ox - readings 96-97% No hx of COPD or astha, but does have OSA with use of CPAP. No current shortness of breath or difficulty walking.  He reported numbness in his left arm post-surgery, likely due to prolonged blood pressure cuff use or arm positioning during the procedure. The numbness has improved, but he still experiences slight, intermittent tingling. He avoids using the affected arm for blood pressure measurements. Happy it is resolving.  His surgical scars are healing well with no signs of infection, though  he notes some tenderness when sitting upright. He continues to use an incentive spirometer to aid in recovery. No fever, increased swelling, or significant tenderness at the surgical sites.        10/23/2023    3:49 PM 02/27/2023    3:36 PM 11/28/2022    3:08 PM  Depression screen PHQ 2/9  Decreased Interest 0 0 0  Down, Depressed, Hopeless 0 0 0  PHQ - 2 Score 0 0 0  Altered sleeping 1 2   Tired, decreased energy 0 1   Change in appetite 1 0   Feeling bad or failure about yourself  0 0   Trouble concentrating 0 0   Moving slowly or fidgety/restless 0 0   Suicidal thoughts 0 0   PHQ-9 Score 2 3   Difficult doing work/chores Not difficult at all Not difficult at all        10/23/2023    3:50 PM  GAD 7 : Generalized Anxiety Score  Nervous, Anxious, on Edge 0  Control/stop worrying 0  Worry too much - different things 0  Trouble relaxing 0  Restless 0  Easily annoyed or irritable 0  Afraid - awful might happen 0  Total GAD 7 Score 0  Anxiety Difficulty Not difficult at all     Review of Systems  All other systems reviewed and are negative.   Negative unless indicated in HPI   Objective:     BP 113/82   Pulse 80   Temp 98 F (36.7 C)   Ht 5\' 11"  (1.803 m)   Wt 283 lb 4.8 oz (128.5 kg)  SpO2 97%   BMI 39.51 kg/m    Physical Exam Vitals reviewed.  Constitutional:      General: He is not in acute distress.    Appearance: Normal appearance. He is not ill-appearing, toxic-appearing or diaphoretic.  HENT:     Head: Normocephalic.     Nose: Nose normal.     Mouth/Throat:     Mouth: Mucous membranes are moist.  Eyes:     Extraocular Movements: Extraocular movements intact.     Conjunctiva/sclera: Conjunctivae normal.     Pupils: Pupils are equal, round, and reactive to light.  Cardiovascular:     Rate and Rhythm: Normal rate and regular rhythm.     Heart sounds: No murmur heard.    No friction rub. No gallop.  Pulmonary:     Effort: Pulmonary effort is  normal. No respiratory distress.     Breath sounds: Normal breath sounds. No stridor. No wheezing, rhonchi or rales.  Chest:     Chest wall: No tenderness.  Abdominal:     General: Abdomen is protuberant. A surgical scar is present. Bowel sounds are normal.     Palpations: Abdomen is soft.     Tenderness: There is no right CVA tenderness or left CVA tenderness.     Hernia: No hernia is present.       Comments: Four laparoscopic healing scars, three are glued, RUQ site pink, healing site, no drainage, normal temperature, graulation tissue present, no edema.  Mild tenderness d/t recent surgery, per pt much better than before  Musculoskeletal:     Right lower leg: No edema.     Left lower leg: No edema.  Skin:    General: Skin is warm and dry.     Capillary Refill: Capillary refill takes less than 2 seconds.  Neurological:     General: No focal deficit present.     Mental Status: He is alert and oriented to person, place, and time. Mental status is at baseline.     Cranial Nerves: No cranial nerve deficit.     Sensory: No sensory deficit.     Motor: No weakness.     Coordination: Coordination normal.     Gait: Gait normal.     No results found for any visits on 10/23/23.    The ASCVD Risk score (Arnett DK, et al., 2019) failed to calculate for the following reasons:   The valid total cholesterol range is 130 to 320 mg/dL    Assessment & Plan:  S/P laparoscopic cholecystectomy Assessment & Plan: Laparoscopic surgery for gall bladder removal on 10/15/23,discharged from hospital on 10/18/23 Four lap sites present on abdomen - healing well, no signs of infection, pt afebrile Denies any concerns today, feels much better since procedure Pt's labs were trending down and normalizing at discharge, has gen surgery appt on 10/28/23 for f/u Referral placed for GI given recent procedure and incidental finding of pancreatic cyst on MRI.     Orders: -     Ambulatory referral to  Gastroenterology  Pancreatic cyst Assessment & Plan: Incidental finding on abdominal MRI  Advised for further evaluation and monitoring Denies abdominal pain today, other than post surgical tenderness Hx prediabetes, otherwise no known hx of pancreatic issues Referral placed for GI  Orders: -     Ambulatory referral to Gastroenterology  OSA (obstructive sleep apnea) Assessment & Plan: Chronic, Stable with CPAP use  Did have some difficulty post anesthesia with oxygen use needs, but since resolved Breath sounds clear bilaterally  during visit, no SOB, DOB. Sp02 = 97% Patient advised to continue CPAP nightly       Return for annual physical.   I, Sallee Provencal, FNP, have reviewed all documentation for this visit. The documentation on 10/23/23 for the exam, diagnosis, procedures, and orders are all accurate and complete.   Sallee Provencal, FNP

## 2023-10-23 NOTE — Assessment & Plan Note (Signed)
Incidental finding on abdominal MRI  Advised for further evaluation and monitoring Denies abdominal pain today, other than post surgical tenderness Hx prediabetes, otherwise no known hx of pancreatic issues Referral placed for GI

## 2023-10-28 ENCOUNTER — Other Ambulatory Visit: Payer: Self-pay | Admitting: Family Medicine

## 2023-10-28 DIAGNOSIS — I10 Essential (primary) hypertension: Secondary | ICD-10-CM

## 2023-10-31 DIAGNOSIS — B360 Pityriasis versicolor: Secondary | ICD-10-CM | POA: Diagnosis not present

## 2023-12-17 ENCOUNTER — Telehealth: Payer: Self-pay | Admitting: Family Medicine

## 2023-12-17 MED ORDER — ALLOPURINOL 100 MG PO TABS: 100.0000 mg | ORAL_TABLET | Freq: Two times a day (BID) | ORAL | 2 refills | Status: DC

## 2023-12-17 NOTE — Telephone Encounter (Signed)
[facility] faxed refill request for the following medications:  allopurinol (ZYLOPRIM) 100 MG tablet   Please advise.

## 2023-12-19 ENCOUNTER — Other Ambulatory Visit: Payer: Self-pay

## 2023-12-19 MED ORDER — ALLOPURINOL 100 MG PO TABS: 100.0000 mg | ORAL_TABLET | Freq: Two times a day (BID) | ORAL | 2 refills | Status: DC

## 2023-12-19 NOTE — Telephone Encounter (Signed)
 Received another refill request for this medication from [facility].  Looks like it was sent to [facility] instead.  Please send to correct pharmacy.

## 2023-12-23 DIAGNOSIS — R208 Other disturbances of skin sensation: Secondary | ICD-10-CM | POA: Diagnosis not present

## 2023-12-23 DIAGNOSIS — D485 Neoplasm of uncertain behavior of skin: Secondary | ICD-10-CM | POA: Diagnosis not present

## 2023-12-23 DIAGNOSIS — L538 Other specified erythematous conditions: Secondary | ICD-10-CM | POA: Diagnosis not present

## 2023-12-23 DIAGNOSIS — L820 Inflamed seborrheic keratosis: Secondary | ICD-10-CM | POA: Diagnosis not present

## 2023-12-23 DIAGNOSIS — L2989 Other pruritus: Secondary | ICD-10-CM | POA: Diagnosis not present

## 2024-01-02 ENCOUNTER — Other Ambulatory Visit: Payer: Self-pay

## 2024-01-06 ENCOUNTER — Ambulatory Visit: Payer: Medicare HMO | Admitting: Gastroenterology

## 2024-01-06 ENCOUNTER — Encounter: Payer: Self-pay | Admitting: Gastroenterology

## 2024-01-06 VITALS — BP 156/93 | HR 73 | Temp 97.7°F | Ht 71.0 in | Wt 285.2 lb

## 2024-01-06 DIAGNOSIS — K760 Fatty (change of) liver, not elsewhere classified: Secondary | ICD-10-CM | POA: Diagnosis not present

## 2024-01-06 DIAGNOSIS — D490 Neoplasm of unspecified behavior of digestive system: Secondary | ICD-10-CM

## 2024-01-06 DIAGNOSIS — K8689 Other specified diseases of pancreas: Secondary | ICD-10-CM | POA: Diagnosis not present

## 2024-01-06 DIAGNOSIS — R7989 Other specified abnormal findings of blood chemistry: Secondary | ICD-10-CM

## 2024-01-06 DIAGNOSIS — K862 Cyst of pancreas: Secondary | ICD-10-CM | POA: Diagnosis not present
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
## 2024-01-07 ENCOUNTER — Encounter: Payer: Self-pay | Admitting: Gastroenterology

## 2024-01-07 ENCOUNTER — Telehealth: Payer: Self-pay

## 2024-01-07 DIAGNOSIS — M2390 Unspecified internal derangement of unspecified knee: Secondary | ICD-10-CM | POA: Insufficient documentation

## 2024-01-07 LAB — HEPATIC FUNCTION PANEL
ALT: 24 IU/L (ref 0–44)
AST: 17 IU/L (ref 0–40)
Albumin: 4.5 g/dL (ref 3.9–4.9)
Alkaline Phosphatase: 124 IU/L — ABNORMAL HIGH (ref 44–121)
Bilirubin Total: 0.5 mg/dL (ref 0.0–1.2)
Bilirubin, Direct: 0.19 mg/dL (ref 0.00–0.40)
Total Protein: 6.9 g/dL (ref 6.0–8.5)

## 2024-01-07 NOTE — Telephone Encounter (Signed)
-----   Message from [facility] sent at 01/07/2024 11:46 AM EDT ----- showed mildly elevated alkaline phosphatase levels, bilirubin is normal.  Recheck LFTs in 3 months.  Recommend healthy lifestyle for management of fatty liver  RV

## 2024-01-07 NOTE — Telephone Encounter (Signed)
 Patient verbalized understanding of results. Put a reminder for 3 months for repeats labs

## 2024-02-12 ENCOUNTER — Encounter: Payer: Self-pay | Admitting: Family Medicine

## 2024-02-12 ENCOUNTER — Ambulatory Visit (INDEPENDENT_AMBULATORY_CARE_PROVIDER_SITE_OTHER): Payer: Self-pay | Admitting: Family Medicine

## 2024-02-12 VITALS — BP 120/79 | HR 68 | Ht 71.0 in | Wt 284.0 lb

## 2024-02-12 DIAGNOSIS — G4733 Obstructive sleep apnea (adult) (pediatric): Secondary | ICD-10-CM

## 2024-02-12 DIAGNOSIS — M791 Myalgia, unspecified site: Secondary | ICD-10-CM | POA: Diagnosis not present

## 2024-02-12 DIAGNOSIS — E785 Hyperlipidemia, unspecified: Secondary | ICD-10-CM

## 2024-02-12 DIAGNOSIS — E538 Deficiency of other specified B group vitamins: Secondary | ICD-10-CM | POA: Diagnosis not present

## 2024-02-12 DIAGNOSIS — I10 Essential (primary) hypertension: Secondary | ICD-10-CM | POA: Diagnosis not present

## 2024-02-12 DIAGNOSIS — Z0001 Encounter for general adult medical examination with abnormal findings: Secondary | ICD-10-CM

## 2024-02-12 DIAGNOSIS — Z0000 Encounter for general adult medical examination without abnormal findings: Secondary | ICD-10-CM

## 2024-02-12 DIAGNOSIS — E559 Vitamin D deficiency, unspecified: Secondary | ICD-10-CM

## 2024-02-12 DIAGNOSIS — R7303 Prediabetes: Secondary | ICD-10-CM

## 2024-02-12 DIAGNOSIS — Z6839 Body mass index (BMI) 39.0-39.9, adult: Secondary | ICD-10-CM

## 2024-02-12 DIAGNOSIS — Z130 Encounter for screening for diseases of the blood and blood-forming organs and certain disorders involving the immune mechanism: Secondary | ICD-10-CM

## 2024-02-12 DIAGNOSIS — E66812 Obesity, class 2: Secondary | ICD-10-CM

## 2024-02-12 MED ORDER — ROSUVASTATIN CALCIUM 10 MG PO TABS: 10.0000 mg | ORAL_TABLET | Freq: Every day | ORAL | 3 refills | Status: AC

## 2024-02-12 NOTE — Assessment & Plan Note (Signed)
 Chronic  Concern for myalgias related to Crestor  40mg   Will replace with Crestor  10mg  daily and recheck lipid panel in 3 months  Lipid panel ordered today  Recommended starting every other day regimen if myalgias continue  Ordered CK today to assess for signs of muscle breakdown  - consider switching to atorvastatin if symptoms continue

## 2024-02-12 NOTE — Patient Instructions (Signed)
 Start Crestor  10mg  daily for 10days to 2 weeks and if still having leg pain start Crestor  10mg  every other day   Please follow up with me in 3 months to recheck cholesterol
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
## 2024-02-12 NOTE — Assessment & Plan Note (Signed)
 Chronic conditions are stable  Patient was counseled on benefits of regular physical activity with goal of 150 minutes of moderate to vigurous intensity 4 days per week  Patient was counseled to consume well balanced diet of fruits, vegetables, limited saturated fats and limited sugary foods and beverages with emphasis on consuming 6-8 glasses of water daily  Screening recommended today: A1c, lipids,CMP,CBC  Colon cancer screening: UTD, next due 2029   Cervical CA screening: N/A   Mammogram: N/A  Lung CA screening CT: never smoker  Vaccines recommended today: COVID booster, UTD on pneumoccocal, Shingrix and tdap

## 2024-02-20 DIAGNOSIS — R7303 Prediabetes: Secondary | ICD-10-CM | POA: Diagnosis not present

## 2024-02-20 DIAGNOSIS — E559 Vitamin D deficiency, unspecified: Secondary | ICD-10-CM | POA: Diagnosis not present

## 2024-02-20 DIAGNOSIS — E785 Hyperlipidemia, unspecified: Secondary | ICD-10-CM | POA: Diagnosis not present

## 2024-02-20 DIAGNOSIS — I10 Essential (primary) hypertension: Secondary | ICD-10-CM | POA: Diagnosis not present

## 2024-02-20 DIAGNOSIS — E538 Deficiency of other specified B group vitamins: Secondary | ICD-10-CM | POA: Diagnosis not present

## 2024-02-20 DIAGNOSIS — Z130 Encounter for screening for diseases of the blood and blood-forming organs and certain disorders involving the immune mechanism: Secondary | ICD-10-CM | POA: Diagnosis not present

## 2024-02-21 DIAGNOSIS — M19072 Primary osteoarthritis, left ankle and foot: Secondary | ICD-10-CM | POA: Diagnosis not present

## 2024-02-21 DIAGNOSIS — M1712 Unilateral primary osteoarthritis, left knee: Secondary | ICD-10-CM | POA: Diagnosis not present

## 2024-02-21 LAB — CMP14+EGFR
ALT: 27 IU/L (ref 0–44)
AST: 20 IU/L (ref 0–40)
Albumin: 4.6 g/dL (ref 3.9–4.9)
Alkaline Phosphatase: 129 IU/L — ABNORMAL HIGH (ref 44–121)
BUN/Creatinine Ratio: 13 (ref 10–24)
BUN: 16 mg/dL (ref 8–27)
Bilirubin Total: 0.4 mg/dL (ref 0.0–1.2)
CO2: 21 mmol/L (ref 20–29)
Calcium: 9.7 mg/dL (ref 8.6–10.2)
Chloride: 104 mmol/L (ref 96–106)
Creatinine, Ser: 1.23 mg/dL (ref 0.76–1.27)
Globulin, Total: 2.5 g/dL (ref 1.5–4.5)
Glucose: 127 mg/dL — ABNORMAL HIGH (ref 70–99)
Potassium: 4.3 mmol/L (ref 3.5–5.2)
Sodium: 142 mmol/L (ref 134–144)
Total Protein: 7.1 g/dL (ref 6.0–8.5)
eGFR: 64 mL/min/{1.73_m2} (ref >59)

## 2024-02-21 LAB — CBC
Hematocrit: 42.7 % (ref 37.5–51.0)
Hemoglobin: 13.7 g/dL (ref 13.0–17.7)
MCH: 29.8 pg (ref 26.6–33.0)
MCHC: 32.1 g/dL (ref 31.5–35.7)
MCV: 93 fL (ref 79–97)
Platelets: 193 10*3/uL (ref 150–450)
RBC: 4.6 x10E6/uL (ref 4.14–5.80)
RDW: 13.6 % (ref 11.6–15.4)
WBC: 5.7 10*3/uL (ref 3.4–10.8)

## 2024-02-21 LAB — LIPID PANEL
Chol/HDL Ratio: 3.9 ratio (ref 0.0–5.0)
Cholesterol, Total: 139 mg/dL (ref 100–199)
HDL: 36 mg/dL — ABNORMAL LOW (ref >39)
LDL Chol Calc (NIH): 65 mg/dL (ref 0–99)
Triglycerides: 237 mg/dL — ABNORMAL HIGH (ref 0–149)
VLDL Cholesterol Cal: 38 mg/dL (ref 5–40)

## 2024-02-21 LAB — CK: Total CK: 50 U/L (ref 41–331)

## 2024-02-21 LAB — HEMOGLOBIN A1C
Est. average glucose Bld gHb Est-mCnc: 128 mg/dL
Hgb A1c MFr Bld: 6.1 % — ABNORMAL HIGH (ref 4.8–5.6)

## 2024-02-21 LAB — VITAMIN B12: Vitamin B-12: 789 pg/mL (ref 232–1245)

## 2024-02-21 LAB — VITAMIN D 25 HYDROXY (VIT D DEFICIENCY, FRACTURES): Vit D, 25-Hydroxy: 41 ng/mL (ref 30.0–100.0)

## 2024-02-25 ENCOUNTER — Other Ambulatory Visit: Payer: Self-pay

## 2024-02-25 ENCOUNTER — Ambulatory Visit: Payer: Self-pay | Admitting: Family Medicine

## 2024-02-25 DIAGNOSIS — E782 Mixed hyperlipidemia: Secondary | ICD-10-CM

## 2024-02-25 MED ORDER — FENOFIBRATE 48 MG PO TABS: 48.0000 mg | ORAL_TABLET | Freq: Every day | ORAL | 1 refills | Status: DC

## 2024-03-02 ENCOUNTER — Other Ambulatory Visit: Payer: Self-pay | Admitting: Family Medicine

## 2024-03-02 DIAGNOSIS — E782 Mixed hyperlipidemia: Secondary | ICD-10-CM

## 2024-03-02 MED ORDER — FENOFIBRATE 48 MG PO TABS
48.0000 mg | ORAL_TABLET | Freq: Every day | ORAL | 1 refills | Status: DC
Start: 2024-03-02 — End: 2024-08-19

## 2024-03-02 NOTE — Telephone Encounter (Signed)
 Copied from CRM [id number]. Topic: Clinical - Prescription Issue >> Mar 02, 2024  2:43 PM Fonda T wrote: Reason for CRM: Patient calling states medication, fenofibrate  (TRICOR ) 48 MG tablet, was sent to the incorrect pharmacy.  [facility], and No longer [facility], which has also been updated in chart.  Patient requesting a return call once this update has been sent to pharmacy, as he states has been waiting to start new medication as prescribed by provider.  Patient can be reached [phone]  Preferred Pharmacy: [id number] - [place], [place] - [address] [place] [address] Phone: [phone] Fax: [phone]

## 2024-03-02 NOTE — Telephone Encounter (Signed)
 Requested Prescriptions  Pending Prescriptions Disp Refills   fenofibrate  (TRICOR ) 48 MG tablet 90 tablet 1    Sig: Take 1 tablet (48 mg total) by mouth daily.     Cardiovascular:  Antilipid - Fibric Acid Derivatives Failed - 03/02/2024  3:15 PM      Failed - Lipid Panel in normal range within the last 12 months    Cholesterol, Total  Date Value Ref Range Status  02/20/2024 139 100 - 199 mg/dL Final   LDL Chol Calc (NIH)  Date Value Ref Range Status  02/20/2024 65 0 - 99 mg/dL Final   HDL  Date Value Ref Range Status  02/20/2024 36 (L) >39 mg/dL Final   Triglycerides  Date Value Ref Range Status  02/20/2024 237 (H) 0 - 149 mg/dL Final         Passed - ALT in normal range and within 360 days    ALT  Date Value Ref Range Status  02/20/2024 27 0 - 44 IU/L Final   SGPT (ALT)  Date Value Ref Range Status  01/25/2013 30 12 - 78 U/L Final         Passed - AST in normal range and within 360 days    AST  Date Value Ref Range Status  02/20/2024 20 0 - 40 IU/L Final   SGOT(AST)  Date Value Ref Range Status  01/25/2013 36 15 - 37 Unit/L Final         Passed - Cr in normal range and within 360 days    Creatinine  Date Value Ref Range Status  01/25/2013 1.74 (H) 0.60 - 1.30 mg/dL Final   Creatinine, Ser  Date Value Ref Range Status  02/20/2024 1.23 0.76 - 1.27 mg/dL Final         Passed - HGB in normal range and within 360 days    Hemoglobin  Date Value Ref Range Status  02/20/2024 13.7 13.0 - 17.7 g/dL Final         Passed - HCT in normal range and within 360 days    Hematocrit  Date Value Ref Range Status  02/20/2024 42.7 37.5 - 51.0 % Final         Passed - PLT in normal range and within 360 days    Platelets  Date Value Ref Range Status  02/20/2024 193 150 - 450 x10E3/uL Final         Passed - WBC in normal range and within 360 days    WBC  Date Value Ref Range Status  02/20/2024 5.7 3.4 - 10.8 x10E3/uL Final  01/26/2013 12.5 (H) 4.0 - 10.5 K/uL Final          Passed - eGFR is 30 or above and within 360 days    EGFR (African American)  Date Value Ref Range Status  01/25/2013 49 (L)  Final   GFR calc Af Amer  Date Value Ref Range Status  11/09/2020 73 >59 mL/min/1.73 Final    Comment:    **In accordance with recommendations from the NKF-ASN Task force,**   [facility] is in the process of updating its eGFR calculation to the   2021 CKD-EPI creatinine equation that estimates kidney function   without a race variable.    EGFR (Non-African Amer.)  Date Value Ref Range Status  01/25/2013 42 (L)  Final    Comment:    eGFR values <26mL/min/1.73 m2 may be an indication of chronic kidney disease (CKD). Calculated eGFR is useful in patients with stable  renal function. The eGFR calculation will not be reliable in acutely ill patients when serum creatinine is changing rapidly. It is not useful in  patients on dialysis. The eGFR calculation may not be applicable to patients at the low and high extremes of body sizes, pregnant women, and vegetarians.    GFR calc non Af Amer  Date Value Ref Range Status  11/09/2020 63 >59 mL/min/1.73 Final   eGFR  Date Value Ref Range Status  02/20/2024 64 >59 mL/min/1.73 Final         Passed - Valid encounter within last 12 months    Recent Outpatient Visits           2 weeks ago Annual physical exam   [facility] Simmons-Robinson, Judyann Number, MD       Future Appointments             In 4 months Stoioff, Kizzie Perks, MD [facility]

## 2024-03-24 DIAGNOSIS — G4733 Obstructive sleep apnea (adult) (pediatric): Secondary | ICD-10-CM | POA: Diagnosis not present

## 2024-04-09 ENCOUNTER — Encounter: Payer: Self-pay | Admitting: Family Medicine

## 2024-04-09 ENCOUNTER — Ambulatory Visit
Admission: RE | Admit: 2024-04-09 | Discharge: 2024-04-09 | Disposition: A | Discharge status: Home / Self Care | Attending: Family Medicine | Admitting: Family Medicine

## 2024-04-09 ENCOUNTER — Ambulatory Visit
Admission: RE | Admit: 2024-04-09 | Discharge: 2024-04-09 | Disposition: A | Discharge status: Home / Self Care | Source: Ambulatory Visit | Attending: Family Medicine | Admitting: Family Medicine

## 2024-04-09 ENCOUNTER — Ambulatory Visit: Payer: Self-pay

## 2024-04-09 ENCOUNTER — Telehealth: Admitting: Family Medicine

## 2024-04-09 DIAGNOSIS — R052 Subacute cough: Secondary | ICD-10-CM

## 2024-04-09 DIAGNOSIS — I771 Stricture of artery: Secondary | ICD-10-CM | POA: Diagnosis not present

## 2024-04-09 DIAGNOSIS — J40 Bronchitis, not specified as acute or chronic: Secondary | ICD-10-CM

## 2024-04-09 DIAGNOSIS — R053 Chronic cough: Secondary | ICD-10-CM | POA: Diagnosis not present

## 2024-04-09 DIAGNOSIS — J9811 Atelectasis: Secondary | ICD-10-CM | POA: Diagnosis not present

## 2024-04-09 DIAGNOSIS — I517 Cardiomegaly: Secondary | ICD-10-CM | POA: Diagnosis not present

## 2024-04-09 MED ORDER — PREDNISONE 20 MG PO TABS: ORAL_TABLET | ORAL | 0 refills | Status: AC

## 2024-04-09 MED ORDER — PANTOPRAZOLE SODIUM 40 MG PO TBEC: 40.0000 mg | DELAYED_RELEASE_TABLET | Freq: Every day | ORAL | 1 refills | Status: DC

## 2024-04-09 NOTE — Patient Instructions (Signed)
 Please report to [facility] located at:  [address]  [place], [place] [address]  You do not need an appointment to have xrays completed.   Our office will follow up with  results once available.

## 2024-04-09 NOTE — Telephone Encounter (Signed)
  FYI Only or Action Required?: FYI only for provider.  Patient was last seen in primary care on 02/12/2024 by Sharma Coyer, MD.  Called Nurse Triage reporting Cough.  Symptoms began about a month ago.  Interventions attempted: OTC medications: Claritin  and different cough syrups.  Symptoms are: unchanged.  Triage Disposition: See PCP Within 2 Weeks  Patient/caregiver understands and will follow disposition?: Yes        Patient has had persistent cough for about a month. Patient believes it may possibly be post nasal drip, has been sneezing. Patient has had some phlegm in the past but not recently. Patient has used allergy medication and cough medication but still has cough.   Patient requesting appointment, [phone]    Reason for Disposition  Cough lasts > 3 weeks  Answer Assessment - Initial Assessment Questions 1. ONSET: When did the cough begin?      Over a month 2. SEVERITY: How bad is the cough today?      mild 3. SPUTUM: Describe the color of your sputum (e.g., none, dry cough; clear, white, yellow, green)     no 4. HEMOPTYSIS: Are you coughing up any blood? If so ask: How much? (e.g., flecks, streaks, tablespoons, etc.)     no 5. DIFFICULTY BREATHING: Are you having difficulty breathing? If Yes, ask: How bad is it? (e.g., mild, moderate, severe)      no 6. FEVER: Do you have a fever? If Yes, ask: What is your temperature, how was it measured, and when did it start?     no  8. LUNG HISTORY: Do you have any history of lung disease?  (e.g., pulmonary embolus, asthma, emphysema)     no 9. PE RISK FACTORS: Do you have a history of blood clots? (or: recent major surgery, recent prolonged travel, bedridden)     no 10. OTHER SYMPTOMS: Do you have any other symptoms? (e.g., runny nose, wheezing, chest pain)       Post nasal drip  Protocols used: Cough - Chronic-A-AH
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
## 2024-04-09 NOTE — Telephone Encounter (Signed)
 PT Telephone encounter reviewed.

## 2024-04-09 NOTE — Telephone Encounter (Signed)
 1st attempt, no answer. Left voicemail for patient to return call to nurse triage.   Message from [place] L sent at 04/09/2024 10:06 AM EDT  Patient has had persistent cough for about a month. Patient believes it may possibly be post nasal drip, has been sneezing. Patient has had some phlegm in the past but not recently. Patient has used allergy medication and cough medication but still has cough.  Patient requesting appointment, [phone]

## 2024-04-10 ENCOUNTER — Ambulatory Visit: Payer: Self-pay | Admitting: Family Medicine

## 2024-05-08 DIAGNOSIS — B360 Pityriasis versicolor: Secondary | ICD-10-CM | POA: Diagnosis not present

## 2024-05-08 DIAGNOSIS — D2261 Melanocytic nevi of right upper limb, including shoulder: Secondary | ICD-10-CM | POA: Diagnosis not present

## 2024-05-08 DIAGNOSIS — L820 Inflamed seborrheic keratosis: Secondary | ICD-10-CM | POA: Diagnosis not present

## 2024-05-08 DIAGNOSIS — D2272 Melanocytic nevi of left lower limb, including hip: Secondary | ICD-10-CM | POA: Diagnosis not present

## 2024-05-08 DIAGNOSIS — D2262 Melanocytic nevi of left upper limb, including shoulder: Secondary | ICD-10-CM | POA: Diagnosis not present

## 2024-05-08 DIAGNOSIS — L538 Other specified erythematous conditions: Secondary | ICD-10-CM | POA: Diagnosis not present

## 2024-05-08 DIAGNOSIS — D225 Melanocytic nevi of trunk: Secondary | ICD-10-CM | POA: Diagnosis not present

## 2024-05-12 ENCOUNTER — Telehealth: Payer: Self-pay

## 2024-05-12 DIAGNOSIS — R7989 Other specified abnormal findings of blood chemistry: Secondary | ICD-10-CM

## 2024-05-12 DIAGNOSIS — E782 Mixed hyperlipidemia: Secondary | ICD-10-CM

## 2024-05-12 DIAGNOSIS — K760 Fatty (change of) liver, not elsewhere classified: Secondary | ICD-10-CM

## 2024-05-12 NOTE — Telephone Encounter (Signed)
 Copied from CRM [id number]. Topic: Clinical - Request for Lab/Test Order >> May 12, 2024  2:15 PM Lauren C wrote: Reason for CRM: Rolan would like to know if there's any bloodwork he can have done prior to his appt 8/22 since he has been taking a new medication for his triglycerides. He would like to have the numbers in prior to the appointment if possible. Please give him a call back at [phone]

## 2024-05-13 DIAGNOSIS — R7989 Other specified abnormal findings of blood chemistry: Secondary | ICD-10-CM | POA: Diagnosis not present

## 2024-05-13 DIAGNOSIS — E782 Mixed hyperlipidemia: Secondary | ICD-10-CM | POA: Diagnosis not present

## 2024-05-13 DIAGNOSIS — K760 Fatty (change of) liver, not elsewhere classified: Secondary | ICD-10-CM | POA: Diagnosis not present

## 2024-05-13 NOTE — Telephone Encounter (Signed)
 Recommend patient come in for fasting lipid panel and hepatic function panel prior to appt   PT can visit the lab without an appointment Mon-Fri between 8A-11:30A or 1P-4:30P.

## 2024-05-13 NOTE — Telephone Encounter (Signed)
 Patient came by and had labs done this morning.

## 2024-05-13 NOTE — Addendum Note (Signed)
 Addended by: SIMMONS-ROBINSON, Dixon Luczak L on: 05/13/2024 07:38 AM   Modules accepted: Orders

## 2024-05-14 LAB — LIPID PANEL
Chol/HDL Ratio: 3.4 ratio (ref 0.0–5.0)
Cholesterol, Total: 127 mg/dL (ref 100–199)
HDL: 37 mg/dL — ABNORMAL LOW (ref >39)
LDL Chol Calc (NIH): 68 mg/dL (ref 0–99)
Triglycerides: 119 mg/dL (ref 0–149)
VLDL Cholesterol Cal: 22 mg/dL (ref 5–40)

## 2024-05-14 LAB — HEPATIC FUNCTION PANEL
ALT: 25 IU/L (ref 0–44)
AST: 19 IU/L (ref 0–40)
Albumin: 4.4 g/dL (ref 3.9–4.9)
Alkaline Phosphatase: 90 IU/L (ref 44–121)
Bilirubin Total: 0.6 mg/dL (ref 0.0–1.2)
Bilirubin, Direct: 0.22 mg/dL (ref 0.00–0.40)
Total Protein: 6.7 g/dL (ref 6.0–8.5)

## 2024-05-15 ENCOUNTER — Ambulatory Visit: Admitting: Family Medicine

## 2024-05-15 ENCOUNTER — Encounter: Payer: Self-pay | Admitting: Family Medicine

## 2024-05-15 ENCOUNTER — Ambulatory Visit: Payer: Self-pay | Admitting: Family Medicine

## 2024-05-15 VITALS — BP 92/52 | HR 2 | Resp 16 | Ht 71.0 in | Wt 280.0 lb

## 2024-05-15 DIAGNOSIS — Z6839 Body mass index (BMI) 39.0-39.9, adult: Secondary | ICD-10-CM

## 2024-05-15 DIAGNOSIS — K219 Gastro-esophageal reflux disease without esophagitis: Secondary | ICD-10-CM

## 2024-05-15 DIAGNOSIS — I10 Essential (primary) hypertension: Secondary | ICD-10-CM

## 2024-05-15 DIAGNOSIS — E559 Vitamin D deficiency, unspecified: Secondary | ICD-10-CM

## 2024-05-15 DIAGNOSIS — G4733 Obstructive sleep apnea (adult) (pediatric): Secondary | ICD-10-CM

## 2024-05-15 DIAGNOSIS — E782 Mixed hyperlipidemia: Secondary | ICD-10-CM

## 2024-05-15 DIAGNOSIS — E66812 Obesity, class 2: Secondary | ICD-10-CM

## 2024-05-15 DIAGNOSIS — M8588 Other specified disorders of bone density and structure, other site: Secondary | ICD-10-CM | POA: Diagnosis not present

## 2024-05-15 DIAGNOSIS — R0982 Postnasal drip: Secondary | ICD-10-CM

## 2024-05-15 DIAGNOSIS — T17308A Unspecified foreign body in larynx causing other injury, initial encounter: Secondary | ICD-10-CM

## 2024-05-15 MED ORDER — FLUTICASONE PROPIONATE 50 MCG/ACT NA SUSP
2.0000 | Freq: Every day | NASAL | 6 refills | Status: AC
Start: 2024-05-15 — End: ?
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
## 2024-06-02 DIAGNOSIS — H2513 Age-related nuclear cataract, bilateral: Secondary | ICD-10-CM | POA: Diagnosis not present

## 2024-06-02 DIAGNOSIS — H353131 Nonexudative age-related macular degeneration, bilateral, early dry stage: Secondary | ICD-10-CM | POA: Diagnosis not present

## 2024-06-02 DIAGNOSIS — H33311 Horseshoe tear of retina without detachment, right eye: Secondary | ICD-10-CM | POA: Diagnosis not present

## 2024-06-02 DIAGNOSIS — H35372 Puckering of macula, left eye: Secondary | ICD-10-CM | POA: Diagnosis not present

## 2024-06-16 DIAGNOSIS — K219 Gastro-esophageal reflux disease without esophagitis: Secondary | ICD-10-CM | POA: Diagnosis not present

## 2024-06-16 DIAGNOSIS — R059 Cough, unspecified: Secondary | ICD-10-CM | POA: Diagnosis not present

## 2024-06-16 DIAGNOSIS — J9589 Other postprocedural complications and disorders of respiratory system, not elsewhere classified: Secondary | ICD-10-CM | POA: Diagnosis not present

## 2024-06-18 ENCOUNTER — Telehealth: Payer: Self-pay

## 2024-06-18 NOTE — Telephone Encounter (Signed)
 Copied from CRM [id number]. Topic: General - Other >> Jun 18, 2024  9:58 AM Zebedee SAUNDERS wrote: Reason for CRM:Pt called regarding amLODipine -benazepril  (LOTREL) 10-40 MG capsule stated ENT recommended a different class of medication for pt. Please would like a call back at [phone]

## 2024-06-19 ENCOUNTER — Other Ambulatory Visit: Payer: Self-pay | Admitting: Family Medicine

## 2024-06-19 MED ORDER — AMLODIPINE BESYLATE 10 MG PO TABS: 10.0000 mg | ORAL_TABLET | Freq: Every day | ORAL | 3 refills | Status: DC

## 2024-06-19 NOTE — Telephone Encounter (Signed)
 Please advise Jose Lewis that I have reviewed the ENT visit note and would recommend a trial of amlodipine  10mg  daily only without the benazepril . I have sent in a prescription for the amlodipine . Please have him check blood pressures daily at least one hour after amlodipine    Please continue Protonix  as prescribed for Reflux symptoms

## 2024-06-19 NOTE — Telephone Encounter (Signed)
 Called patient and made aware. Patient will start med and check Bp at home. F/U scheduled for 10/13 2pm

## 2024-06-24 ENCOUNTER — Other Ambulatory Visit: Payer: Self-pay | Admitting: Unknown Physician Specialty

## 2024-06-24 ENCOUNTER — Ambulatory Visit: Payer: Self-pay

## 2024-06-24 DIAGNOSIS — J9589 Other postprocedural complications and disorders of respiratory system, not elsewhere classified: Secondary | ICD-10-CM

## 2024-06-24 DIAGNOSIS — I10 Essential (primary) hypertension: Secondary | ICD-10-CM

## 2024-06-24 NOTE — Telephone Encounter (Signed)
 If adding/changing Rx, please use: [id number] - [place], [place] - [address] [[id number]]   FYI Only or Action Required?: Action required by provider: clinical question for provider.  Patient was last seen in primary care on 05/15/2024 by Sharma Coyer, MD.  Called Nurse Triage reporting Hypertension.  Symptoms began several days ago.  Interventions attempted: Prescription medications: amlodipine .  Symptoms are: gradually worsening.  Triage Disposition: Discuss With PCP and Callback by Nurse Today (overriding See PCP Within 2 Weeks)  Patient/caregiver understands and will follow disposition?:   Reason for Disposition  [1] Systolic BP >= 130 OR Diastolic >= 80 AND [2] taking BP medications  Answer Assessment - Initial Assessment Questions Patient states that he is currently taking only amlodipine  10 mg since 06/20/24 and his BP has been elevated. Also reports head pressure, describes as right below a throbbing.   1. BLOOD PRESSURE: What is your blood pressure? Did you take at least two measurements 5 minutes apart?     As high as 152/91 today, gradual increase since change in regimen on 06/20/24  2. ONSET: When did you take your blood pressure?     BID since 06/20/24  3. HOW: How did you take your blood pressure? (e.g., automatic home BP monitor, visiting nurse)     Monitor  4. HISTORY: Do you have a history of high blood pressure?     Yes  5. MEDICINES: Are you taking any medicines for blood pressure? Have you missed any doses recently?     Amlodipine  10 mg  Protocols used: Blood Pressure - High-A-AH Copied from CRM #[id number]. Topic: Clinical - Prescription Issue >> Jun 24, 2024 11:03 AM Rosaria BRAVO wrote: Reason for CRM: Pt called to report that his current blood pressure medication is not working and that he is having problems with it. Please advise   amLODipine  (NORVASC ) 10 MG tablet  Best contact: [phone]

## 2024-06-25 MED ORDER — LOSARTAN POTASSIUM 50 MG PO TABS: 50.0000 mg | ORAL_TABLET | Freq: Every day | ORAL | 3 refills | Status: DC

## 2024-06-25 NOTE — Telephone Encounter (Signed)
 Patient called and aware of message. States cough has improved but not completely resolved

## 2024-06-25 NOTE — Addendum Note (Signed)
 Addended by: SIMMONS-ROBINSON, Yohanna Tow L on: 06/25/2024 01:13 PM   Modules accepted: Orders

## 2024-06-25 NOTE — Telephone Encounter (Signed)
 Please have patient continue amlodipine  10mg  daily and start Losartan 50mg  daily   Please confirm improvement or resoluation of the cough he was having

## 2024-06-26 ENCOUNTER — Other Ambulatory Visit: Payer: Self-pay | Admitting: Family Medicine

## 2024-06-26 DIAGNOSIS — I10 Essential (primary) hypertension: Secondary | ICD-10-CM

## 2024-06-26 NOTE — Telephone Encounter (Signed)
 Copied from CRM [id number]. Topic: Clinical - Medication Question >> Jun 26, 2024 10:59 AM Myrick T wrote: Reason for CRM: patient stated [facility] [place] but they have no script for losartan (COZAAR) 50 MG tablet. Please f/u with pharmacy to ensure they have the script. >> Jun 26, 2024  2:27 PM Larissa RAMAN wrote: Patient states pharmacy has not received prescription for losartan, requesting to have this sent today.   [id number] - [place], [place] - [address]  [address] [place] [address]  Phone: [phone] Fax: [phone]  Hours: Not open 24 hours

## 2024-06-29 ENCOUNTER — Other Ambulatory Visit: Payer: Self-pay | Admitting: Family Medicine

## 2024-06-29 DIAGNOSIS — I10 Essential (primary) hypertension: Secondary | ICD-10-CM

## 2024-06-29 MED ORDER — LOSARTAN POTASSIUM 50 MG PO TABS: 50.0000 mg | ORAL_TABLET | Freq: Every day | ORAL | 3 refills | Status: DC

## 2024-06-29 NOTE — Telephone Encounter (Signed)
 This pt came in and states that the pharmacy has not received this medication.  It shows that it was called in.  His BP was 165/90 on Saturday.  Please send prescription to pharmacy.

## 2024-06-29 NOTE — Telephone Encounter (Signed)
 Advised patient to take both medications together

## 2024-06-29 NOTE — Telephone Encounter (Signed)
 Dr. Lang sent in the medication.  Pt is wanting to know if he should take this with the other medication.  Please call pt and advise.

## 2024-06-29 NOTE — Telephone Encounter (Signed)
 Pt should take 50mg  losartan daily with amlodipine  10mg  daily

## 2024-06-29 NOTE — Telephone Encounter (Signed)
 Yes take both amlodipine  10mg  daily and losartan 50mg  daily

## 2024-07-01 ENCOUNTER — Ambulatory Visit: Admitting: Family Medicine

## 2024-07-02 ENCOUNTER — Ambulatory Visit
Admission: RE | Admit: 2024-07-02 | Discharge: 2024-07-02 | Disposition: A | Discharge status: Home / Self Care | Source: Ambulatory Visit | Attending: Unknown Physician Specialty | Admitting: Unknown Physician Specialty

## 2024-07-02 DIAGNOSIS — T17928A Food in respiratory tract, part unspecified causing other injury, initial encounter: Secondary | ICD-10-CM | POA: Insufficient documentation

## 2024-07-02 DIAGNOSIS — I10 Essential (primary) hypertension: Secondary | ICD-10-CM | POA: Diagnosis not present

## 2024-07-02 DIAGNOSIS — E785 Hyperlipidemia, unspecified: Secondary | ICD-10-CM | POA: Insufficient documentation

## 2024-07-02 DIAGNOSIS — R053 Chronic cough: Secondary | ICD-10-CM | POA: Insufficient documentation

## 2024-07-02 DIAGNOSIS — E669 Obesity, unspecified: Secondary | ICD-10-CM | POA: Diagnosis not present

## 2024-07-02 DIAGNOSIS — J9589 Other postprocedural complications and disorders of respiratory system, not elsewhere classified: Secondary | ICD-10-CM | POA: Insufficient documentation

## 2024-07-02 DIAGNOSIS — W44F3XA Food entering into or through a natural orifice, initial encounter: Secondary | ICD-10-CM | POA: Diagnosis not present

## 2024-07-02 DIAGNOSIS — K219 Gastro-esophageal reflux disease without esophagitis: Secondary | ICD-10-CM | POA: Insufficient documentation

## 2024-07-02 DIAGNOSIS — T17308A Unspecified foreign body in larynx causing other injury, initial encounter: Secondary | ICD-10-CM | POA: Insufficient documentation

## 2024-07-02 DIAGNOSIS — R0982 Postnasal drip: Secondary | ICD-10-CM | POA: Insufficient documentation

## 2024-07-02 DIAGNOSIS — G4733 Obstructive sleep apnea (adult) (pediatric): Secondary | ICD-10-CM | POA: Insufficient documentation

## 2024-07-02 DIAGNOSIS — R1310 Dysphagia, unspecified: Secondary | ICD-10-CM | POA: Insufficient documentation

## 2024-07-02 NOTE — Therapy (Signed)
 Modified Barium Swallow Study  Patient Details  Name: Jose Lewis MRN: [id number] Date of Birth: 03/08/1956  Today's Date: 07/02/2024  Modified Barium Swallow completed.  Full report located under Chart Review in the Imaging Section.  History of Present Illness Pt is a 68 y.o male who presents for MBSS. Pt with chronic cough, LPR, and concern for microaspiration per ENT note. Laryngoscopy 06/16/24 omega shaped epiglottis, no tumor or mass seen, good VC mobility. CXR 04/09/24 negative. PMHx: LPR, avitaminosis D, HLD, HTN, obesity, OSA.   Clinical Impression Pt demonstrated intact oropharyngeal swallow function. Esophageal screening was unremarkable. Recommend continuation of a regular diet with thin liquids with standard aspiration and reflux precautions. No f/u ST recommended. Factors that may increase risk of adverse event in presence of aspiration Noe & Lianne 2021):  N/A  Swallow Evaluation Recommendations Recommendations: PO diet PO Diet Recommendation: Regular;Thin liquids (Level 0) Liquid Administration via: Spoon;Cup;Straw Medication Administration:  (as tolerated) Supervision: Patient able to self-feed Swallowing strategies  :  (standard aspiration and reflux precuations) Postural changes: Position pt fully upright for meals;Stay upright 30-60 min after meals Oral care recommendations: Oral care BID (2x/day) Recommended consults:  (Consider pulmonology consult for chronic cough)     Delon Bangs, M.S., CCC-SLP Speech-Language Pathologist [facility] [phone] (ASCOM)  Delon HERO Leopoldo Mazzie 07/02/2024,1:26 PM

## 2024-07-06 ENCOUNTER — Ambulatory Visit (INDEPENDENT_AMBULATORY_CARE_PROVIDER_SITE_OTHER): Admitting: Family Medicine

## 2024-07-06 ENCOUNTER — Other Ambulatory Visit: Payer: Self-pay | Admitting: Family Medicine

## 2024-07-06 ENCOUNTER — Encounter: Payer: Self-pay | Admitting: Family Medicine

## 2024-07-06 VITALS — BP 132/68 | HR 69 | Ht 71.0 in | Wt 292.2 lb

## 2024-07-06 DIAGNOSIS — I10 Essential (primary) hypertension: Secondary | ICD-10-CM

## 2024-07-06 DIAGNOSIS — Z23 Encounter for immunization: Secondary | ICD-10-CM

## 2024-07-06 DIAGNOSIS — J40 Bronchitis, not specified as acute or chronic: Secondary | ICD-10-CM

## 2024-07-06 MED ORDER — LOSARTAN POTASSIUM 100 MG PO TABS: 100.0000 mg | ORAL_TABLET | Freq: Every day | ORAL | 4 refills | Status: AC
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
## 2024-07-06 NOTE — Patient Instructions (Addendum)
                          Contains text generated by [employer]bridge.                                 Contains text generated by [employer]bridge.     To keep you healthy, please keep in mind the following health maintenance items that you are due for:   Health Maintenance Due  Topic Date Due   Influenza Vaccine  04/24/2024   COVID-19 Vaccine (7 - 2025-26 season) 05/25/2024     Best Wishes,   Dr. Lang

## 2024-07-16 ENCOUNTER — Other Ambulatory Visit: Payer: Self-pay

## 2024-07-16 DIAGNOSIS — Z8546 Personal history of malignant neoplasm of prostate: Secondary | ICD-10-CM

## 2024-07-17 ENCOUNTER — Ambulatory Visit
Admission: RE | Admit: 2024-07-17 | Discharge: 2024-07-17 | Disposition: A | Discharge status: Home / Self Care | Attending: Urology | Admitting: Urology

## 2024-07-17 ENCOUNTER — Ambulatory Visit
Admission: RE | Admit: 2024-07-17 | Discharge: 2024-07-17 | Disposition: A | Discharge status: Home / Self Care | Source: Ambulatory Visit | Attending: Urology | Admitting: Urology

## 2024-07-17 ENCOUNTER — Other Ambulatory Visit

## 2024-07-17 DIAGNOSIS — Z8546 Personal history of malignant neoplasm of prostate: Secondary | ICD-10-CM | POA: Diagnosis not present

## 2024-07-17 DIAGNOSIS — N200 Calculus of kidney: Secondary | ICD-10-CM

## 2024-07-17 DIAGNOSIS — I878 Other specified disorders of veins: Secondary | ICD-10-CM | POA: Diagnosis not present

## 2024-07-17 DIAGNOSIS — K5730 Diverticulosis of large intestine without perforation or abscess without bleeding: Secondary | ICD-10-CM | POA: Diagnosis not present

## 2024-07-18 LAB — PSA: Prostate Specific Ag, Serum: <0.1 ng/mL (ref 0.0–4.0)

## 2024-07-21 ENCOUNTER — Ambulatory Visit: Payer: Self-pay | Admitting: Urology

## 2024-07-22 ENCOUNTER — Ambulatory Visit: Payer: Self-pay | Admitting: Urology

## 2024-07-22 VITALS — BP 150/86 | HR 81 | Ht 71.0 in | Wt 285.0 lb

## 2024-07-22 DIAGNOSIS — Z8546 Personal history of malignant neoplasm of prostate: Secondary | ICD-10-CM

## 2024-07-22 DIAGNOSIS — N200 Calculus of kidney: Secondary | ICD-10-CM
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
## 2024-07-24 ENCOUNTER — Encounter: Payer: Self-pay | Admitting: Urology

## 2024-07-27 ENCOUNTER — Other Ambulatory Visit: Payer: Self-pay | Admitting: *Deleted

## 2024-07-27 DIAGNOSIS — N200 Calculus of kidney: Secondary | ICD-10-CM

## 2024-08-19 ENCOUNTER — Encounter: Payer: Self-pay | Admitting: Family Medicine

## 2024-08-19 ENCOUNTER — Ambulatory Visit: Admitting: Family Medicine

## 2024-08-19 VITALS — BP 124/74 | HR 67 | Resp 16 | Ht 71.0 in | Wt 285.0 lb

## 2024-08-19 DIAGNOSIS — L819 Disorder of pigmentation, unspecified: Secondary | ICD-10-CM

## 2024-08-19 DIAGNOSIS — I10 Essential (primary) hypertension: Secondary | ICD-10-CM

## 2024-08-19 DIAGNOSIS — E782 Mixed hyperlipidemia: Secondary | ICD-10-CM

## 2024-08-19 DIAGNOSIS — M19072 Primary osteoarthritis, left ankle and foot: Secondary | ICD-10-CM

## 2024-08-19 MED ORDER — FENOFIBRATE 48 MG PO TABS: 48.0000 mg | ORAL_TABLET | Freq: Every day | ORAL | 3 refills | Status: AC

## 2024-08-19 MED ORDER — FENOFIBRATE 48 MG PO TABS
48.0000 mg | ORAL_TABLET | Freq: Every day | ORAL | 1 refills | Status: DC
Start: 2024-08-19 — End: 2024-08-19

## 2024-08-19 NOTE — Assessment & Plan Note (Addendum)
 Chronic  Blood pressure is well-controlled with current regimen of amlodipine  10 mg daily and losartan  100 mg daily. Home readings are inconsistent, possibly due to stress and potential issues with home blood pressure cuff. Office readings are within normal range. No changes to medication regimen are necessary at this time. - Continue amlodipine  10 mg daily. - Continue losartan  100 mg daily. - Monitor blood pressure with sister's manual checks. - Consider increasing losartan  to 150 mg (100mg  in the AM and 50mg  in the evening) daily if home readings remain elevated above 140/90 - Refilled fenofibrate  prescription.

## 2024-08-19 NOTE — Assessment & Plan Note (Signed)
 Chronic osteoarthritis in the left ankle with intermittent pain. Managed with meloxicam 15 mg, taken intermittently to avoid long-term NSAID use. Previous steroid injections provided temporary relief. Pain may contribute to elevated home blood pressure readings. Surgical options are limited and not preferred. - Continue meloxicam 15 mg as needed, with breaks to avoid long-term use. - Consider steroid injections for temporary relief if planning increased activity. - Consult orthopedics for additional management options.

## 2024-08-19 NOTE — Patient Instructions (Signed)
 To keep you healthy, please keep in mind the following health maintenance items that you are due for:   Health Maintenance Due  Topic Date Due   COVID-19 Vaccine (7 - 2025-26 season) 05/25/2024     Best Wishes,   Dr. Lang
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
## 2024-08-19 NOTE — Assessment & Plan Note (Signed)
 Chronic  Fenofibrate  has been effective in reducing triglyceride levels. He has a short supply of fenofibrate  and requires a refill. - Refilled fenofibrate  48mg  daily prescription. -continue Crestor  10mg  daily

## 2024-09-01 ENCOUNTER — Ambulatory Visit
Admission: RE | Admit: 2024-09-01 | Discharge: 2024-09-01 | Disposition: A | Source: Ambulatory Visit | Attending: Urology | Admitting: Urology

## 2024-09-01 DIAGNOSIS — N200 Calculus of kidney: Secondary | ICD-10-CM | POA: Diagnosis not present

## 2024-09-01 DIAGNOSIS — I878 Other specified disorders of veins: Secondary | ICD-10-CM | POA: Diagnosis not present

## 2024-09-07 ENCOUNTER — Telehealth: Payer: Self-pay

## 2024-09-07 NOTE — Telephone Encounter (Signed)
 The density overlying the sacrum seen on prior KUB is no longer present and was most likely bowel contents

## 2024-09-07 NOTE — Telephone Encounter (Signed)
 Pt returned call and I was able to give him results from Dr. Twylla. Pt voiced understanding.

## 2024-09-07 NOTE — Telephone Encounter (Signed)
 Pt LM on triage line requesting results of his KUB. Please advise.

## 2024-09-07 NOTE — Telephone Encounter (Signed)
Left message to return the call

## 2024-09-22 ENCOUNTER — Telehealth: Payer: Self-pay | Admitting: Family Medicine

## 2024-09-22 MED ORDER — ALLOPURINOL 100 MG PO TABS: 100.0000 mg | ORAL_TABLET | Freq: Two times a day (BID) | ORAL | 2 refills | Status: AC

## 2024-09-22 NOTE — Telephone Encounter (Signed)
 Refilled

## 2024-09-22 NOTE — Telephone Encounter (Signed)
 Copied from CRM #[id number]. Topic: Clinical - Medication Refill >> Sep 22, 2024 10:24 AM Nathanel BROCKS wrote: Medication: allopurinol  (ZYLOPRIM ) 100 MG tablet  Has the patient contacted their pharmacy? Yes   This is the patient's preferred pharmacy:  [id number] - [place], [place] - [address] [place] [address] Phone: [phone] Fax: [phone]  Is this the correct pharmacy for this prescription? Yes If no, delete pharmacy and type the correct one.   Has the prescription been filled recently? Yes  Is the patient out of the medication? Yes  Has the patient been seen for an appointment in the last year OR does the patient have an upcoming appointment? Yes  Can we respond through MyChart? No  Agent: Please be advised that Rx refills may take up to 3 business days. We ask that you follow-up with your pharmacy.

## 2024-10-12 ENCOUNTER — Other Ambulatory Visit: Payer: Self-pay | Admitting: Family Medicine

## 2024-10-12 DIAGNOSIS — I10 Essential (primary) hypertension: Secondary | ICD-10-CM

## 2025-07-21 ENCOUNTER — Other Ambulatory Visit

## 2025-07-23 ENCOUNTER — Ambulatory Visit: Admitting: Urology
